# Patient Record
Sex: Female | Born: 1975 | Race: Black or African American | Hispanic: No | Marital: Single | Smoking: Never smoker
Health system: Southern US, Community
[De-identification: ages and names within clinical notes are randomized; demographics above are authoritative.]

## PROBLEM LIST (undated history)

## (undated) DIAGNOSIS — S3992XA Unspecified injury of lower back, initial encounter: Secondary | ICD-10-CM

## (undated) DIAGNOSIS — E7800 Pure hypercholesterolemia, unspecified: Secondary | ICD-10-CM

## (undated) DIAGNOSIS — I471 Supraventricular tachycardia, unspecified: Secondary | ICD-10-CM

## (undated) HISTORY — PX: LAPAROSCOPIC OVARIAN: SHX5906

---

## 2015-05-27 ENCOUNTER — Emergency Department (HOSPITAL_COMMUNITY)
Admission: EM | Admit: 2015-05-27 | Discharge: 2015-05-28 | Disposition: A | Discharge status: Home / Self Care | Payer: Medicaid Other | Attending: Emergency Medicine | Admitting: Emergency Medicine

## 2015-05-27 ENCOUNTER — Encounter (HOSPITAL_COMMUNITY): Payer: Self-pay

## 2015-05-27 DIAGNOSIS — Y92008 Other place in unspecified non-institutional (private) residence as the place of occurrence of the external cause: Secondary | ICD-10-CM | POA: Diagnosis not present

## 2015-05-27 DIAGNOSIS — Y9389 Activity, other specified: Secondary | ICD-10-CM | POA: Diagnosis not present

## 2015-05-27 DIAGNOSIS — Y998 Other external cause status: Secondary | ICD-10-CM | POA: Insufficient documentation

## 2015-05-27 DIAGNOSIS — W25XXXA Contact with sharp glass, initial encounter: Secondary | ICD-10-CM | POA: Insufficient documentation

## 2015-05-27 DIAGNOSIS — S61011A Laceration without foreign body of right thumb without damage to nail, initial encounter: Secondary | ICD-10-CM | POA: Diagnosis present

## 2015-05-27 DIAGNOSIS — Z23 Encounter for immunization: Secondary | ICD-10-CM | POA: Insufficient documentation

## 2015-05-27 DIAGNOSIS — S61219A Laceration without foreign body of unspecified finger without damage to nail, initial encounter: Secondary | ICD-10-CM

## 2015-05-27 NOTE — ED Notes (Signed)
I cut my right thumb over glass in a screen door per pt.

## 2015-05-28 MED ORDER — IBUPROFEN 800 MG PO TABS
800.0000 mg | ORAL_TABLET | Freq: Once | ORAL | Status: AC
  Administered 2015-05-28: 800 mg via ORAL
  Filled 2015-05-28: qty 1

## 2015-05-28 MED ORDER — LIDOCAINE HCL (PF) 1 % IJ SOLN
INTRAMUSCULAR | Status: AC
  Administered 2015-05-28: 01:00:00
  Filled 2015-05-28: qty 5

## 2015-05-28 MED ORDER — BACITRACIN-NEOMYCIN-POLYMYXIN 400-5-5000 EX OINT
TOPICAL_OINTMENT | Freq: Once | CUTANEOUS | Status: AC
  Administered 2015-05-28: 1 via TOPICAL
  Filled 2015-05-28: qty 1

## 2015-05-28 MED ORDER — TETANUS-DIPHTH-ACELL PERTUSSIS 5-2.5-18.5 LF-MCG/0.5 IM SUSP
0.5000 mL | Freq: Once | INTRAMUSCULAR | Status: AC
  Administered 2015-05-28: 0.5 mL via INTRAMUSCULAR
  Filled 2015-05-28: qty 0.5

## 2015-05-28 NOTE — ED Provider Notes (Signed)
CSN: [id number]     Arrival date & time 05/27/15  2222 History   First MD Initiated Contact with Patient 05/28/15 [phone]     Chief Complaint  Patient presents with  . Extremity Laceration     (Consider location/radiation/quality/duration/timing/severity/associated sxs/prior Treatment) Patient is a 39 y.o. female presenting with skin laceration. The history is provided by the patient.  Laceration Location:  Hand Hand laceration location:  R finger Depth:  Through underlying tissue Quality: jagged   Bleeding: controlled with pressure   Time since incident: just prior to arrival. Tetanus status:  Out of date  Shaunee Rochele RaringFaust is a 39 y.o. female who presents to the ED with a laceration to the right thumb. She states that she was on her porch and put her hand through the screen door. She has pain and bleeding to the right thumb. She denies any other injuries.  History reviewed. No pertinent past medical history. History reviewed. No pertinent past surgical history. No family history on file. History  Substance Use Topics  . Smoking status: Never Smoker   . Smokeless tobacco: Not on file  . Alcohol Use: No   OB History    No data available     Review of Systems Negative except as stated in HPI   Allergies  Metoprolol and Sulfa antibiotics  Home Medications   Prior to Admission medications   Not on File   BP 129/77 mmHg  Pulse 82  Temp(Src) 98.4 F (36.9 C) (Oral)  Resp 20  SpO2 100% Physical Exam  Constitutional: She is oriented to person, place, and time. She appears well-developed and well-nourished. No distress.  HENT:  Head: Normocephalic.  Eyes: EOM are normal.  Neck: Neck supple.  Cardiovascular: Normal rate.   Pulmonary/Chest: Effort normal.  Abdominal: Soft. There is no tenderness.  Musculoskeletal: Normal range of motion.       Right hand: She exhibits laceration. She exhibits normal range of motion. Normal sensation noted. Normal strength noted.   Neurological: She is alert and oriented to person, place, and time. No cranial nerve deficit.  Skin: Skin is warm and dry.  Psychiatric: She has a normal mood and affect. Her behavior is normal.  Nursing note and vitals reviewed.   ED Course  Procedures  LACERATION REPAIR Performed by: Breckyn Ticas Authorized by: Derry Arbogast Consent: Verbal consent obtained. Risks and benefits: risks, benefits and alternatives were discussed Consent given by: patient Patient identity confirmed: provided demographic data Prepped and Draped in normal sterile fashion Wound explored  Laceration Location: right thumb  Laceration Length: 3 cm  No Foreign Bodies seen or palpated  Anesthesia: local infiltration  Local anesthetic: lidocaine 1% without epinephrine  Anesthetic total: 4 ml  Irrigation method: syringe Amount of cleaning: standard  Skin closure: 4-0 prolene  Number of sutures: 5  Technique: interrupted  Patient tolerance: Patient tolerated the procedure well with no immediate complications.   MDM  39 y.o. female with laceration of the right thumb stable for d/c without focal neuro deficits of the right hand. She will follow up in 10 days for suture removal at her PCP or here. She will return sooner for any problems.   Final diagnoses:  Laceration of finger, right, initial encounter      [facility] Leven Hoel, NP 05/29/15 1634  Paula LibraJohn Molpus, MD 05/30/15 [phone]

## 2018-10-12 ENCOUNTER — Other Ambulatory Visit (HOSPITAL_COMMUNITY): Payer: Self-pay | Admitting: Preventative Medicine

## 2018-10-12 DIAGNOSIS — S20219A Contusion of unspecified front wall of thorax, initial encounter: Secondary | ICD-10-CM

## 2018-10-18 ENCOUNTER — Ambulatory Visit (HOSPITAL_COMMUNITY)
Admission: RE | Admit: 2018-10-18 | Discharge: 2018-10-18 | Disposition: A | Discharge status: Home / Self Care | Payer: Medicaid Other | Source: Ambulatory Visit | Attending: Preventative Medicine | Admitting: Preventative Medicine

## 2018-10-18 ENCOUNTER — Ambulatory Visit (HOSPITAL_COMMUNITY)
Admission: RE | Admit: 2018-10-18 | Discharge: 2018-10-18 | Disposition: A | Discharge status: Home / Self Care | Payer: Self-pay | Source: Ambulatory Visit | Attending: Preventative Medicine | Admitting: Preventative Medicine

## 2018-10-18 DIAGNOSIS — S20219A Contusion of unspecified front wall of thorax, initial encounter: Secondary | ICD-10-CM | POA: Insufficient documentation

## 2018-10-18 MED ORDER — TECHNETIUM TC 99M MEDRONATE IV KIT
20.0000 | PACK | Freq: Once | INTRAVENOUS | Status: AC | PRN
  Administered 2018-10-18: 21.9 via INTRAVENOUS

## 2019-06-01 ENCOUNTER — Other Ambulatory Visit: Payer: Self-pay

## 2019-06-01 ENCOUNTER — Emergency Department (HOSPITAL_COMMUNITY)
Admission: EM | Admit: 2019-06-01 | Discharge: 2019-06-01 | Disposition: A | Discharge status: Home / Self Care | Payer: Medicaid Other | Attending: Emergency Medicine | Admitting: Emergency Medicine

## 2019-06-01 ENCOUNTER — Emergency Department (HOSPITAL_COMMUNITY): Payer: Medicaid Other

## 2019-06-01 ENCOUNTER — Encounter (HOSPITAL_COMMUNITY): Payer: Self-pay | Admitting: Emergency Medicine

## 2019-06-01 DIAGNOSIS — Y92410 Unspecified street and highway as the place of occurrence of the external cause: Secondary | ICD-10-CM | POA: Insufficient documentation

## 2019-06-01 DIAGNOSIS — Y998 Other external cause status: Secondary | ICD-10-CM | POA: Diagnosis not present

## 2019-06-01 DIAGNOSIS — Y9389 Activity, other specified: Secondary | ICD-10-CM | POA: Diagnosis not present

## 2019-06-01 DIAGNOSIS — S39012A Strain of muscle, fascia and tendon of lower back, initial encounter: Secondary | ICD-10-CM | POA: Insufficient documentation

## 2019-06-01 DIAGNOSIS — S3992XA Unspecified injury of lower back, initial encounter: Secondary | ICD-10-CM | POA: Diagnosis present

## 2019-06-01 DIAGNOSIS — M47816 Spondylosis without myelopathy or radiculopathy, lumbar region: Secondary | ICD-10-CM | POA: Insufficient documentation

## 2019-06-01 MED ORDER — DIAZEPAM 5 MG PO TABS: 5.0000 mg | ORAL_TABLET | Freq: Three times a day (TID) | ORAL | 0 refills | Status: AC | PRN

## 2019-06-01 MED ORDER — LORAZEPAM 1 MG PO TABS: 1.0000 mg | ORAL_TABLET | Freq: Once | ORAL | Status: DC

## 2019-06-01 MED ORDER — HYDROCODONE-ACETAMINOPHEN 5-325 MG PO TABS
1.0000 | ORAL_TABLET | ORAL | 0 refills | Status: AC | PRN
Start: 2019-06-01 — End: ?

## 2019-06-01 MED ORDER — HYDROCODONE-ACETAMINOPHEN 5-325 MG PO TABS
1.0000 | ORAL_TABLET | Freq: Once | ORAL | Status: AC
  Administered 2019-06-01: 21:00:00 1 via ORAL
  Filled 2019-06-01: qty 1
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
## 2019-06-01 NOTE — Discharge Instructions (Addendum)
Use ice on the sore spots for 2 days after that use heat.  See your doctor for problems.
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
## 2020-08-08 ENCOUNTER — Emergency Department (HOSPITAL_BASED_OUTPATIENT_CLINIC_OR_DEPARTMENT_OTHER): Payer: BC Managed Care – PPO

## 2020-08-08 ENCOUNTER — Ambulatory Visit
Admission: EM | Admit: 2020-08-08 | Discharge: 2020-08-08 | Disposition: A | Discharge status: Home / Self Care | Payer: BC Managed Care – PPO

## 2020-08-08 ENCOUNTER — Other Ambulatory Visit: Payer: Self-pay

## 2020-08-08 ENCOUNTER — Encounter (HOSPITAL_BASED_OUTPATIENT_CLINIC_OR_DEPARTMENT_OTHER): Payer: Self-pay

## 2020-08-08 ENCOUNTER — Emergency Department (HOSPITAL_BASED_OUTPATIENT_CLINIC_OR_DEPARTMENT_OTHER)
Admission: EM | Admit: 2020-08-08 | Discharge: 2020-08-08 | Disposition: A | Discharge status: Home / Self Care | Payer: BC Managed Care – PPO | Attending: Emergency Medicine | Admitting: Emergency Medicine

## 2020-08-08 DIAGNOSIS — N83291 Other ovarian cyst, right side: Secondary | ICD-10-CM | POA: Insufficient documentation

## 2020-08-08 DIAGNOSIS — R630 Anorexia: Secondary | ICD-10-CM | POA: Diagnosis not present

## 2020-08-08 DIAGNOSIS — R638 Other symptoms and signs concerning food and fluid intake: Secondary | ICD-10-CM | POA: Diagnosis not present

## 2020-08-08 DIAGNOSIS — R1031 Right lower quadrant pain: Secondary | ICD-10-CM

## 2020-08-08 DIAGNOSIS — R110 Nausea: Secondary | ICD-10-CM | POA: Insufficient documentation

## 2020-08-08 DIAGNOSIS — N83201 Unspecified ovarian cyst, right side: Secondary | ICD-10-CM

## 2020-08-08 HISTORY — DX: Unspecified injury of lower back, initial encounter: S39.92XA

## 2020-08-08 LAB — COMPREHENSIVE METABOLIC PANEL
ALT: 16 U/L (ref 0–44)
AST: 13 U/L — ABNORMAL LOW (ref 15–41)
Albumin: 4.1 g/dL (ref 3.5–5.0)
Alkaline Phosphatase: 35 U/L — ABNORMAL LOW (ref 38–126)
Anion gap: 10 (ref 5–15)
BUN: 12 mg/dL (ref 6–20)
CO2: 23 mmol/L (ref 22–32)
Calcium: 9 mg/dL (ref 8.9–10.3)
Chloride: 100 mmol/L (ref 98–111)
Creatinine, Ser: 0.81 mg/dL (ref 0.44–1.00)
GFR calc Af Amer: >60 mL/min (ref >60)
GFR calc non Af Amer: >60 mL/min (ref >60)
Glucose, Bld: 97 mg/dL (ref 70–99)
Potassium: 3.8 mmol/L (ref 3.5–5.1)
Sodium: 133 mmol/L — ABNORMAL LOW (ref 135–145)
Total Bilirubin: 0.5 mg/dL (ref 0.3–1.2)
Total Protein: 8.2 g/dL — ABNORMAL HIGH (ref 6.5–8.1)

## 2020-08-08 LAB — URINALYSIS, ROUTINE W REFLEX MICROSCOPIC
Bilirubin Urine: NEGATIVE
Glucose, UA: NEGATIVE mg/dL
Hgb urine dipstick: NEGATIVE
Ketones, ur: NEGATIVE mg/dL
Nitrite: NEGATIVE
Protein, ur: NEGATIVE mg/dL
Specific Gravity, Urine: >1.03 — ABNORMAL HIGH (ref 1.005–1.030)
pH: 5.5 (ref 5.0–8.0)

## 2020-08-08 LAB — URINALYSIS, MICROSCOPIC (REFLEX)

## 2020-08-08 LAB — LIPASE, BLOOD: Lipase: 27 U/L (ref 11–51)

## 2020-08-08 LAB — CBC
HCT: 39.6 % (ref 36.0–46.0)
Hemoglobin: 12.1 g/dL (ref 12.0–15.0)
MCH: 21.1 pg — ABNORMAL LOW (ref 26.0–34.0)
MCHC: 30.6 g/dL (ref 30.0–36.0)
MCV: 69 fL — ABNORMAL LOW (ref 80.0–100.0)
Platelets: 267 10*3/uL (ref 150–400)
RBC: 5.74 MIL/uL — ABNORMAL HIGH (ref 3.87–5.11)
RDW: 18.2 % — ABNORMAL HIGH (ref 11.5–15.5)
WBC: 6 10*3/uL (ref 4.0–10.5)
nRBC: 0 % (ref 0.0–0.2)

## 2020-08-08 LAB — PREGNANCY, URINE: Preg Test, Ur: NEGATIVE

## 2020-08-08 MED ORDER — IOHEXOL 300 MG/ML  SOLN
100.0000 mL | Freq: Once | INTRAMUSCULAR | Status: AC | PRN
  Administered 2020-08-08: 100 mL via INTRAVENOUS

## 2020-08-08 MED ORDER — MORPHINE SULFATE (PF) 4 MG/ML IV SOLN
4.0000 mg | Freq: Once | INTRAVENOUS | Status: AC
  Administered 2020-08-08: 4 mg via INTRAVENOUS
  Filled 2020-08-08: qty 1

## 2020-08-08 MED ORDER — ONDANSETRON HCL 4 MG/2ML IJ SOLN
4.0000 mg | Freq: Once | INTRAMUSCULAR | Status: AC
  Administered 2020-08-08: 4 mg via INTRAVENOUS
  Filled 2020-08-08: qty 2

## 2020-08-08 MED ORDER — HYDROCODONE-ACETAMINOPHEN 5-325 MG PO TABS
1.0000 | ORAL_TABLET | ORAL | 0 refills | Status: AC | PRN
Start: 2020-08-08 — End: ?

## 2020-08-08 MED ORDER — SODIUM CHLORIDE 0.9 % IV BOLUS
1000.0000 mL | Freq: Once | INTRAVENOUS | Status: AC
  Administered 2020-08-08: 1000 mL via INTRAVENOUS

## 2020-08-08 NOTE — ED Notes (Signed)
ED Provider at bedside. 

## 2020-08-08 NOTE — ED Notes (Signed)
Patient is being discharged from the Urgent Care and sent to the Emergency Department via pov . Per b. wurst patient is in need of higher level of care due to rlq pain r/o appendicitis/ ectopic . Patient is aware and verbalizes understanding of plan of care. There were no vitals filed for this visit.

## 2020-08-08 NOTE — Discharge Instructions (Signed)
Your work-up today did not show evidence of appendicitis but did show a large ovarian cyst.  We offered pelvic exam and ultrasound via transfer tonight but after our discussion, we felt it is reasonable to have you follow-up with your OB/GYN tomorrow for both exam and ultrasound.  Please use the pain medicines up with your discomfort and follow-up with them.  If any symptoms change or worsen acutely, please return to the nearest emergency department.  Please rest and stay hydrated.

## 2020-08-08 NOTE — ED Triage Notes (Signed)
RLQ pain that woke her out of her sleep last night. Denies any urinary s/s

## 2020-08-08 NOTE — ED Provider Notes (Signed)
[facility] EMERGENCY DEPARTMENT Provider Note   CSN: [id number] Arrival date & time: 08/08/20  1434     History Chief Complaint  Patient presents with   Abdominal Pain    Amber Hamilton is a 44 y.o. female.  The history is provided by the patient and medical records. No language interpreter was used.  Abdominal Pain Pain location:  R flank, RLQ and RUQ Pain quality: aching and cramping   Pain radiates to:  Does not radiate Pain severity:  Moderate Onset quality:  Gradual Duration:  1 day Timing:  Constant Progression:  Waxing and waning Chronicity:  New Relieved by:  Nothing Worsened by:  Palpation Ineffective treatments:  None tried Associated symptoms: anorexia and nausea   Associated symptoms: no chest pain, no chills, no constipation, no cough, no diarrhea, no dysuria, no fatigue, no fever, no melena, no shortness of breath, no sore throat, no vaginal bleeding, no vaginal discharge and no vomiting   Risk factors: has not had multiple surgeries, not pregnant and no recent hospitalization        Past Medical History:  Diagnosis Date   Back injury     There are no problems to display for this patient.   History reviewed. No pertinent surgical history.   OB History   No obstetric history on file.     No family history on file.  Social History   Tobacco Use   Smoking status: Never Smoker   Smokeless tobacco: Never Used  Vaping Use   Vaping Use: Never used  Substance Use Topics   Alcohol use: No   Drug use: No    Home Medications Prior to Admission medications   Medication Sig Start Date End Date Taking? Authorizing Provider  diazepam (VALIUM) 5 MG tablet Take 1 tablet (5 mg total) by mouth every 8 (eight) hours as needed for muscle spasms (spasms). 06/01/19   Mancel BaleWentz, Elliott, MD  HYDROcodone-acetaminophen (NORCO) 5-325 MG tablet Take 1 tablet by mouth every 4 (four) hours as needed for moderate pain. 06/01/19   Mancel BaleWentz, Elliott, MD     Allergies    Metoprolol, Skelaxin [metaxalone], and Sulfa antibiotics  Review of Systems   Review of Systems  Constitutional: Positive for appetite change. Negative for chills, diaphoresis, fatigue and fever.  HENT: Negative for congestion and sore throat.   Eyes: Negative for visual disturbance.  Respiratory: Negative for cough, chest tightness, shortness of breath, wheezing and stridor.   Cardiovascular: Negative for chest pain.  Gastrointestinal: Positive for abdominal pain, anorexia and nausea. Negative for abdominal distention, constipation, diarrhea, melena and vomiting.  Genitourinary: Positive for flank pain. Negative for decreased urine volume, difficulty urinating, dysuria, frequency, pelvic pain, urgency, vaginal bleeding, vaginal discharge and vaginal pain.  Musculoskeletal: Positive for back pain. Negative for neck pain and neck stiffness.  Skin: Negative for rash and wound.  Neurological: Negative for light-headedness and headaches.  Psychiatric/Behavioral: Negative for agitation and confusion.  All other systems reviewed and are negative.   Physical Exam Updated Vital Signs BP 126/83 (BP Location: Left Arm)    Pulse 92    Temp 98.9 F (37.2 C) (Oral)    Resp 20    Ht 5\' 4"  (1.626 m)    Wt 103.4 kg    SpO2 99%    BMI 39.14 kg/m   Physical Exam Vitals and nursing note reviewed.  Constitutional:      General: She is not in acute distress.    Appearance: She is well-developed. She is  not ill-appearing, toxic-appearing or diaphoretic.  HENT:     Head: Normocephalic and atraumatic.     Right Ear: External ear normal.     Left Ear: External ear normal.     Nose: Nose normal.     Mouth/Throat:     Pharynx: No oropharyngeal exudate.  Eyes:     Conjunctiva/sclera: Conjunctivae normal.     Pupils: Pupils are equal, round, and reactive to light.  Cardiovascular:     Rate and Rhythm: Normal rate.     Heart sounds: Normal heart sounds. No murmur heard.   Pulmonary:      Effort: Pulmonary effort is normal. No respiratory distress.     Breath sounds: No stridor. No wheezing, rhonchi or rales.  Chest:     Chest wall: No tenderness.  Abdominal:     General: Abdomen is flat. Bowel sounds are normal. There is no distension.     Tenderness: There is abdominal tenderness in the right upper quadrant, right lower quadrant and periumbilical area. There is no right CVA tenderness, left CVA tenderness, guarding or rebound.  Musculoskeletal:     Cervical back: Normal range of motion and neck supple.  Skin:    General: Skin is warm.     Capillary Refill: Capillary refill takes less than 2 seconds.     Findings: No erythema or rash.  Neurological:     General: No focal deficit present.     Mental Status: She is alert and oriented to person, place, and time.     Motor: No abnormal muscle tone.     Coordination: Coordination normal.     Deep Tendon Reflexes: Reflexes are normal and symmetric.  Psychiatric:        Mood and Affect: Mood normal.     ED Results / Procedures / Treatments   Labs (all labs ordered are listed, but only abnormal results are displayed) Labs Reviewed  COMPREHENSIVE METABOLIC PANEL - Abnormal; Notable for the following components:      Result Value   Sodium 133 (*)    Total Protein 8.2 (*)    AST 13 (*)    Alkaline Phosphatase 35 (*)    All other components within normal limits  CBC - Abnormal; Notable for the following components:   RBC 5.74 (*)    MCV 69.0 (*)    MCH 21.1 (*)    RDW 18.2 (*)    All other components within normal limits  URINALYSIS, ROUTINE W REFLEX MICROSCOPIC - Abnormal; Notable for the following components:   Specific Gravity, Urine >1.030 (*)    Leukocytes,Ua TRACE (*)    All other components within normal limits  URINALYSIS, MICROSCOPIC (REFLEX) - Abnormal; Notable for the following components:   Bacteria, UA MANY (*)    All other components within normal limits  LIPASE, BLOOD  PREGNANCY, URINE     EKG None  Radiology CT ABDOMEN PELVIS W CONTRAST  Result Date: 08/08/2020 CLINICAL DATA:  Right lower quadrant pain EXAM: CT ABDOMEN AND PELVIS WITH CONTRAST TECHNIQUE: Multidetector CT imaging of the abdomen and pelvis was performed using the standard protocol following bolus administration of intravenous contrast. CONTRAST:  OMNIPAQUE IOHEXOL 300 MG/ML  SOLN COMPARISON:  None. FINDINGS: Lower chest: Lung bases are clear. No effusions. Heart is normal size. Hepatobiliary: No focal hepatic abnormality. Gallbladder unremarkable. Pancreas: No focal abnormality or ductal dilatation. Spleen: No focal abnormality.  Normal size. Adrenals/Urinary Tract: No adrenal abnormality. No focal renal abnormality. No stones or hydronephrosis. Urinary  bladder is unremarkable. Stomach/Bowel: Few scattered sigmoid diverticula. Moderate stool burden throughout the colon. Stomach, large and small bowel grossly unremarkable. Normal appendix. Vascular/Lymphatic: No evidence of aneurysm or adenopathy. Reproductive: Uterus and left adnexa unremarkable. 9 cm cyst seen in the right adnexa, presumably ovarian. Other: No free fluid or free air. Musculoskeletal: No acute bony abnormality. IMPRESSION: Normal appendix. 9 cm right adnexal/ovarian cyst. This could be further characterized with pelvic ultrasound. Electronically Signed   By: Charlett Nose M.D.   On: 08/08/2020 18:12    Procedures Procedures (including critical care time)  Medications Ordered in ED Medications  morphine 4 MG/ML injection 4 mg (4 mg Intravenous Given 08/08/20 1721)  ondansetron (ZOFRAN) injection 4 mg (4 mg Intravenous Given 08/08/20 1720)  sodium chloride 0.9 % bolus 1,000 mL (0 mLs Intravenous Stopped 08/08/20 1931)  iohexol (OMNIPAQUE) 300 MG/ML solution 100 mL (100 mLs Intravenous Contrast Given 08/08/20 1756)     ED Course  I have reviewed the triage vital signs and the nursing notes.  Pertinent labs & imaging results that were  available during my care of the patient were reviewed by me and considered in my medical decision making (see chart for details).    MDM Rules/Calculators/A&P                          Aariona Amendola is a 44 y.o. female with a past medical history significant for chronic back pain status post intermittent injections who presents at the direction of PCP for evaluation of right lower quadrant abdominal pain.  She reports that yesterday, she started having pain in her right hemiabdomen that she describes as moderate.  She reported some nausea but no vomiting.  She has no appetite.  She denies any vaginal discharge or vaginal bleeding or pelvic symptoms.  She she reported no fevers or chills and denies new trauma.  She denies chest pain, shortness of breath, or breathing difficulties.  She reports no significant urinary changes but does report her pain is in her right flank and back as well.  No rashes or shingles reported.  No history of this discomfort.  She was told to come here for imaging to rule out appendicitis.   On exam, patient does have tenderness in her right upper quadrant and right lower quadrant.  Minimal tenderness in her suprapubic area.  Some tenderness in the right flank but no rash was seen.  No significant back tenderness otherwise with no midline tenderness.  Normal pulses in legs.  No leg tenderness or leg swelling.  Lungs clear and chest nontender.  No murmur.  Patient resting comfortably.  Given her right lower bone abdominal pain and right abdominal pain, I do think a CT would be beneficial to rule out appendicitis, cholecystitis, and less likely looking for pelvic pathology.  Based on her description of of the symptoms and location discomfort I am more concerned about an intra-abdominal pathology over a pelvic pathology at this time.  Also, we do not have pelvic ultrasound available at this facility at this time now.  We also cannot do a rectal quadrant ultrasound initially due to  this.  We will give the patient pain medicine, get labs, give her some fluids, nausea medicine, and get a CT scan to evaluate.  If CT is completely reassuring, we discussed having a shared decision-making conversation about having her follow-up with OB/GYN for pelvic exam/ultrasound versus doing an exam today and then transferring her this evening  for ultrasound.  Patient is agreement with this plan.  Anticipate reassessment after work-up.  CT scan showed no evidence of appendicitis and the appendix was normal-appearing.  Patient did have a large right-sided cyst likely ovarian in etiology that was 9 cm.  Laboratory testing was overall reassuring.  Urinalysis did not show nitrites and given lack of urinary symptoms, doubt UTI.  We held a shared decision-making conversation with the patient.  We offered a pelvic exam and transfer for pelvic ultrasound tonight given the CT scan recommending it should be considered; however, patient would rather call her OB/GYN tomorrow to get both completed by the same provider tomorrow.  She reports he feels much better after pain medicine and now that appendicitis is ruled out, she would like to go home.  We discussed how we did not definitively rule out torsion at this time but patient understands this and would still like to go.  She will given prescription for pain medication and will follow up with her OB/GYN tomorrow for pelvic exam and ultrasound.  She has no other questions or concerns and was discharged in good condition with improved symptoms.      Final Clinical Impression(s) / ED Diagnoses Final diagnoses:  Right lower quadrant abdominal pain  Cyst of right ovary    Rx / DC Orders ED Discharge Orders         Ordered    HYDROcodone-acetaminophen (NORCO/VICODIN) 5-325 MG tablet  Every 4 hours PRN     Discontinue  Reprint     08/08/20 2011          Clinical Impression: 1. Right lower quadrant abdominal pain   2. Cyst of right ovary      Disposition: Discharge  Condition: Good  I have discussed the results, Dx and Tx plan with the pt(& family if present). He/she/they expressed understanding and agree(s) with the plan. Discharge instructions discussed at great length. Strict return precautions discussed and pt &/or family have verbalized understanding of the instructions. No further questions at time of discharge.    New Prescriptions   HYDROCODONE-ACETAMINOPHEN (NORCO/VICODIN) 5-325 MG TABLET    Take 1 tablet by mouth every 4 (four) hours as needed.    Follow Up: Your OBGYN for pelvic exam and ultrasound tomorrow.        Hisae Decoursey, Canary Brim, MD 08/08/20 2012

## 2020-08-08 NOTE — ED Notes (Signed)
Pt ambulated to bathroom without difficulty.

## 2020-08-08 NOTE — ED Triage Notes (Addendum)
Pt c/o intermittient RLQ pain started 7pm yesterday-NAD-steady gait

## 2020-08-24 ENCOUNTER — Emergency Department (HOSPITAL_COMMUNITY)
Admission: EM | Admit: 2020-08-24 | Discharge: 2020-08-24 | Disposition: A | Discharge status: Home / Self Care | Payer: BC Managed Care – PPO | Attending: Emergency Medicine | Admitting: Emergency Medicine

## 2020-08-24 ENCOUNTER — Emergency Department (HOSPITAL_COMMUNITY): Payer: BC Managed Care – PPO

## 2020-08-24 ENCOUNTER — Other Ambulatory Visit: Payer: Self-pay

## 2020-08-24 ENCOUNTER — Encounter (HOSPITAL_COMMUNITY): Payer: Self-pay

## 2020-08-24 DIAGNOSIS — R531 Weakness: Secondary | ICD-10-CM | POA: Diagnosis present

## 2020-08-24 DIAGNOSIS — U071 COVID-19: Secondary | ICD-10-CM | POA: Diagnosis not present

## 2020-08-24 DIAGNOSIS — Z79899 Other long term (current) drug therapy: Secondary | ICD-10-CM | POA: Insufficient documentation

## 2020-08-24 LAB — URINALYSIS, ROUTINE W REFLEX MICROSCOPIC
Bacteria, UA: NONE SEEN
Bilirubin Urine: NEGATIVE
Glucose, UA: NEGATIVE mg/dL
Hgb urine dipstick: NEGATIVE
Ketones, ur: NEGATIVE mg/dL
Nitrite: NEGATIVE
Protein, ur: NEGATIVE mg/dL
Specific Gravity, Urine: 1.024 (ref 1.005–1.030)
pH: 5 (ref 5.0–8.0)

## 2020-08-24 LAB — CBC WITH DIFFERENTIAL/PLATELET
Abs Immature Granulocytes: 0.01 10*3/uL (ref 0.00–0.07)
Basophils Absolute: 0 10*3/uL (ref 0.0–0.1)
Basophils Relative: 0 %
Eosinophils Absolute: 0 10*3/uL (ref 0.0–0.5)
Eosinophils Relative: 0 %
HCT: 39.4 % (ref 36.0–46.0)
Hemoglobin: 12.2 g/dL (ref 12.0–15.0)
Immature Granulocytes: 0 %
Lymphocytes Relative: 52 %
Lymphs Abs: 1.7 10*3/uL (ref 0.7–4.0)
MCH: 21.6 pg — ABNORMAL LOW (ref 26.0–34.0)
MCHC: 31 g/dL (ref 30.0–36.0)
MCV: 69.7 fL — ABNORMAL LOW (ref 80.0–100.0)
Monocytes Absolute: 0.4 10*3/uL (ref 0.1–1.0)
Monocytes Relative: 11 %
Neutro Abs: 1.2 10*3/uL — ABNORMAL LOW (ref 1.7–7.7)
Neutrophils Relative %: 37 %
Platelets: 148 10*3/uL — ABNORMAL LOW (ref 150–400)
RBC: 5.65 MIL/uL — ABNORMAL HIGH (ref 3.87–5.11)
RDW: 18.5 % — ABNORMAL HIGH (ref 11.5–15.5)
WBC: 3.3 10*3/uL — ABNORMAL LOW (ref 4.0–10.5)
nRBC: 0 % (ref 0.0–0.2)

## 2020-08-24 LAB — COMPREHENSIVE METABOLIC PANEL
ALT: 35 U/L (ref 0–44)
AST: 35 U/L (ref 15–41)
Albumin: 3.9 g/dL (ref 3.5–5.0)
Alkaline Phosphatase: 33 U/L — ABNORMAL LOW (ref 38–126)
Anion gap: 10 (ref 5–15)
BUN: 12 mg/dL (ref 6–20)
CO2: 26 mmol/L (ref 22–32)
Calcium: 8.5 mg/dL — ABNORMAL LOW (ref 8.9–10.3)
Chloride: 97 mmol/L — ABNORMAL LOW (ref 98–111)
Creatinine, Ser: 0.86 mg/dL (ref 0.44–1.00)
GFR calc Af Amer: >60 mL/min (ref >60)
GFR calc non Af Amer: >60 mL/min (ref >60)
Glucose, Bld: 118 mg/dL — ABNORMAL HIGH (ref 70–99)
Potassium: 3.5 mmol/L (ref 3.5–5.1)
Sodium: 133 mmol/L — ABNORMAL LOW (ref 135–145)
Total Bilirubin: 0.3 mg/dL (ref 0.3–1.2)
Total Protein: 7.9 g/dL (ref 6.5–8.1)

## 2020-08-24 LAB — SARS CORONAVIRUS 2 BY RT PCR (HOSPITAL ORDER, PERFORMED IN ~~LOC~~ HOSPITAL LAB): SARS Coronavirus 2: POSITIVE — AB

## 2020-08-24 MED ORDER — SODIUM CHLORIDE 0.9 % IV SOLN
1200.0000 mg | Freq: Once | INTRAVENOUS | Status: AC
  Administered 2020-08-24: 1200 mg via INTRAVENOUS
  Filled 2020-08-24: qty 10

## 2020-08-24 MED ORDER — ACETAMINOPHEN 325 MG PO TABS
650.0000 mg | ORAL_TABLET | Freq: Once | ORAL | Status: AC
  Administered 2020-08-24: 650 mg via ORAL
  Filled 2020-08-24: qty 2

## 2020-08-24 MED ORDER — DIPHENHYDRAMINE HCL 50 MG/ML IJ SOLN: 50.0000 mg | Freq: Once | INTRAMUSCULAR | Status: DC | PRN

## 2020-08-24 MED ORDER — EPINEPHRINE 0.3 MG/0.3ML IJ SOAJ: 0.3000 mg | Freq: Once | INTRAMUSCULAR | Status: DC | PRN

## 2020-08-24 MED ORDER — SODIUM CHLORIDE 0.9 % IV SOLN: INTRAVENOUS | Status: DC | PRN

## 2020-08-24 MED ORDER — FAMOTIDINE IN NACL 20-0.9 MG/50ML-% IV SOLN: 20.0000 mg | Freq: Once | INTRAVENOUS | Status: DC | PRN

## 2020-08-24 MED ORDER — ALBUTEROL SULFATE HFA 108 (90 BASE) MCG/ACT IN AERS
2.0000 | INHALATION_SPRAY | Freq: Once | RESPIRATORY_TRACT | Status: AC
  Administered 2020-08-24: 2 via RESPIRATORY_TRACT
  Filled 2020-08-24: qty 6.7

## 2020-08-24 MED ORDER — ALBUTEROL SULFATE HFA 108 (90 BASE) MCG/ACT IN AERS: 2.0000 | INHALATION_SPRAY | Freq: Once | RESPIRATORY_TRACT | Status: DC | PRN

## 2020-08-24 MED ORDER — METHYLPREDNISOLONE SODIUM SUCC 125 MG IJ SOLR: 125.0000 mg | Freq: Once | INTRAMUSCULAR | Status: DC | PRN
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
## 2020-08-24 NOTE — ED Provider Notes (Signed)
[facility] EMERGENCY DEPARTMENT Provider Note   CSN: [id number] Arrival date & time: 08/24/20  1323     History Chief Complaint  Patient presents with  . Covid Exposure  . Extremity Weakness    Amber Hamilton is a 44 y.o. female.  Patient with weakness and fever.  Exposure to COVID-19  The history is provided by the patient. No language interpreter was used.  Weakness Severity:  Mild Onset quality:  Gradual Timing:  Constant Progression:  Worsening Chronicity:  New Context: not alcohol use   Relieved by:  Nothing Worsened by:  Nothing Ineffective treatments:  None tried Associated symptoms: no abdominal pain, no chest pain, no cough, no diarrhea, no frequency, no headaches and no seizures        Past Medical History:  Diagnosis Date  . Back injury     There are no problems to display for this patient.   History reviewed. No pertinent surgical history.   OB History   No obstetric history on file.     No family history on file.  Social History   Tobacco Use  . Smoking status: Never Smoker  . Smokeless tobacco: Never Used  Vaping Use  . Vaping Use: Never used  Substance Use Topics  . Alcohol use: No  . Drug use: No    Home Medications Prior to Admission medications   Medication Sig Start Date End Date Taking? Authorizing Provider  diazepam (VALIUM) 5 MG tablet Take 1 tablet (5 mg total) by mouth every 8 (eight) hours as needed for muscle spasms (spasms). 06/01/19   Mancel Bale, MD  HYDROcodone-acetaminophen (NORCO) 5-325 MG tablet Take 1 tablet by mouth every 4 (four) hours as needed for moderate pain. 06/01/19   Mancel Bale, MD  HYDROcodone-acetaminophen (NORCO/VICODIN) 5-325 MG tablet Take 1 tablet by mouth every 4 (four) hours as needed. 08/08/20   Tegeler, Canary Brim, MD    Allergies    Metoprolol, Skelaxin [metaxalone], and Sulfa antibiotics  Review of Systems   Review of Systems  Constitutional: Negative for appetite change and  fatigue.  HENT: Negative for congestion, ear discharge and sinus pressure.   Eyes: Negative for discharge.  Respiratory: Negative for cough.   Cardiovascular: Negative for chest pain.  Gastrointestinal: Negative for abdominal pain and diarrhea.  Genitourinary: Negative for frequency and hematuria.  Musculoskeletal: Negative for back pain.  Skin: Negative for rash.  Neurological: Positive for weakness. Negative for seizures and headaches.  Psychiatric/Behavioral: Negative for hallucinations.    Physical Exam Updated Vital Signs BP 118/86 (BP Location: Left Arm)   Pulse 87   Temp 99.2 F (37.3 C) (Oral)   Resp 17   Ht 5\' 4"  (1.626 m)   Wt 99.8 kg   SpO2 95%   BMI 37.76 kg/m   Physical Exam Vitals and nursing note reviewed.  Constitutional:      Appearance: She is well-developed.  HENT:     Head: Normocephalic.     Nose: Nose normal.  Eyes:     General: No scleral icterus.    Conjunctiva/sclera: Conjunctivae normal.  Neck:     Thyroid: No thyromegaly.  Cardiovascular:     Rate and Rhythm: Normal rate and regular rhythm.     Heart sounds: No murmur heard.  No friction rub. No gallop.   Pulmonary:     Breath sounds: No stridor. No wheezing or rales.  Chest:     Chest wall: No tenderness.  Abdominal:     General: There is no  distension.     Tenderness: There is no abdominal tenderness. There is no rebound.  Musculoskeletal:        General: Normal range of motion.     Cervical back: Neck supple.  Lymphadenopathy:     Cervical: No cervical adenopathy.  Skin:    Findings: No erythema or rash.  Neurological:     Mental Status: She is oriented to person, place, and time.     Motor: No abnormal muscle tone.     Coordination: Coordination normal.  Psychiatric:        Behavior: Behavior normal.     ED Results / Procedures / Treatments   Labs (all labs ordered are listed, but only abnormal results are displayed) Labs Reviewed  SARS CORONAVIRUS 2 BY RT PCR  (HOSPITAL ORDER, PERFORMED IN  HOSPITAL LAB) - Abnormal; Notable for the following components:      Result Value   SARS Coronavirus 2 POSITIVE (*)    All other components within normal limits  CBC WITH DIFFERENTIAL/PLATELET - Abnormal; Notable for the following components:   WBC 3.3 (*)    RBC 5.65 (*)    MCV 69.7 (*)    MCH 21.6 (*)    RDW 18.5 (*)    Platelets 148 (*)    Neutro Abs 1.2 (*)    All other components within normal limits  COMPREHENSIVE METABOLIC PANEL - Abnormal; Notable for the following components:   Sodium 133 (*)    Chloride 97 (*)    Glucose, Bld 118 (*)    Calcium 8.5 (*)    Alkaline Phosphatase 33 (*)    All other components within normal limits  URINALYSIS, ROUTINE W REFLEX MICROSCOPIC - Abnormal; Notable for the following components:   APPearance HAZY (*)    Leukocytes,Ua TRACE (*)    All other components within normal limits    EKG None  Radiology DG Chest Port 1 View  Result Date: 08/24/2020 CLINICAL DATA:  Shortness of breath, history of recent COVID exposure EXAM: PORTABLE CHEST 1 VIEW COMPARISON:  None. FINDINGS: Cardiac shadow is within normal limits. The lungs are well aerated bilaterally. Minimal bibasilar opacities are noted likely representing atelectasis. No bony abnormality is seen. IMPRESSION: Minimal bibasilar airspace opacities likely representing atelectasis. Electronically Signed   By: Alcide Clever M.D.   On: 08/24/2020 19:25    Procedures Procedures (including critical care time)  Medications Ordered in ED Medications  0.9 %  sodium chloride infusion (has no administration in time range)  diphenhydrAMINE (BENADRYL) injection 50 mg (has no administration in time range)  famotidine (PEPCID) IVPB 20 mg premix (has no administration in time range)  methylPREDNISolone sodium succinate (SOLU-MEDROL) 125 mg/2 mL injection 125 mg (has no administration in time range)  albuterol (VENTOLIN HFA) 108 (90 Base) MCG/ACT inhaler 2 puff  (has no administration in time range)  EPINEPHrine (EPI-PEN) injection 0.3 mg (has no administration in time range)  albuterol (VENTOLIN HFA) 108 (90 Base) MCG/ACT inhaler 2 puff (has no administration in time range)  acetaminophen (TYLENOL) tablet 650 mg (650 mg Oral Given 08/24/20 1906)  casirivimab-imdevimab (REGEN-COV) 1,200 mg in sodium chloride 0.9 % 110 mL IVPB (0 mg Intravenous Stopped 08/24/20 2227)    ED Course  I have reviewed the triage vital signs and the nursing notes.  Pertinent labs & imaging results that were available during my care of the patient were reviewed by me and considered in my medical decision making (see chart for details).  Amber Hamilton was evaluated in Emergency Department on 08/27/2020 for the symptoms described in the history of present illness. She was evaluated in the context of the global COVID-19 pandemic, which necessitated consideration that the patient might be at risk for infection with the SARS-CoV-2 virus that causes COVID-19. Institutional protocols and algorithms that pertain to the evaluation of patients at risk for COVID-19 are in a state of rapid change based on information released by regulatory bodies including the CDC and federal and state organizations. These policies and algorithms were followed during the patient's care in the ED.  MDM Rules/Calculators/A&P                           Patient with COVID-19. She was given the monoclonal antibody and will follow-up as needed       This patient presents to the ED for concern of weakness fever this involves an extensive number of treatment options, and is a complaint that carries with it a high risk of complications and morbidity.  The differential diagnosis includes UTI Covid   Lab Tests:   I Ordered, reviewed, and interpreted labs, which included CBC and chemistries that showed mild leukopenia  Medicines ordered:   I ordered medication albuterol steroids and monoclonal  antibody  Imaging Studies ordered:   I ordered imaging studies which included chest x-ray  I independently visualized and interpreted imaging which showed bibasilar infiltrates  Additional history obtained:   Additional history obtained from record  Previous records obtained and reviewed.  Consultations Obtained:     Reevaluation:  After the interventions stated above, I reevaluated the patient and found mild improvement  Critical Interventions:  .   Final Clinical Impression(s) / ED Diagnoses Final diagnoses:  COVID-19    Rx / DC Orders ED Discharge Orders    None       Bethann Berkshire, MD 08/27/20 1057

## 2020-08-24 NOTE — Progress Notes (Signed)
Pharmacy COVID-19 Monoclonal Antibody Screening  Vaishnavi Wermuth was identified as not hospitalized with symptoms from Covid-19 on admission but an incidental positive PCR has been documented.  The patient may qualify for the use of monoclonal antibodies (mAB) for COVID-19 viral infection to prevent worsening symptoms stemming from Covid-19 infection.  The patient was identified based on a positive COVID-19 PCR and not requiring the use of supplemental oxygen at this time.  Patient is not being admitted for COVID.  Anticipate they will receive the infusion and be discharged home.  This patient meets the FDA criteria for Emergency Use Authorization of casirivimab/imdevimab.  Has a (+) direct SARS-CoV-2 viral test result  Is NOT hospitalized due to COVID-19  Is within 10 days of symptom onset  Has at least one of the high risk factor(s) for progression to severe COVID-19 and/or hospitalization as defined in EUA.  Specific high risk criteria : BMI > 25  Additionally: The patient has had a positive COVID-19 PCR in the last 90 days.  The patient is unvaccinated against COVID-19.  Since the patient is unvaccinated and meets high risk criteria, the patient is eligible for mAB administration.   This eligibility and indication for treatment was initiated by patient's physician: Dr. Estell Harpin  Plan: It was decided that the patient will receive one dose of mAB combination.Casirivimab/imdevimab has been ordered. Pharmacy will coordinate administration timing with patient's nurse. Recommended infusion monitoring parameters communicated to the nursing team.   Brandon Melnick 08/24/2020  10:19 PM

## 2020-08-24 NOTE — ED Triage Notes (Signed)
Pt reports exposed to covid last week. Pt says that she took a rapid test and was positive and pcr was negative. Pt reports weakness, cough and all symptoms that anyone can have when you look up covid

## 2020-08-24 NOTE — ED Notes (Signed)
Regen-Cov completed.  No adverse reaction

## 2020-08-24 NOTE — Discharge Instructions (Addendum)
Take Tylenol for fever drink plenty of fluids and rest and follow-up with your doctor if you are not improving. You can use the inhaler if you get some shortness of breath

## 2020-09-04 ENCOUNTER — Other Ambulatory Visit: Payer: BC Managed Care – PPO

## 2020-09-04 ENCOUNTER — Other Ambulatory Visit: Payer: Self-pay | Admitting: Radiology

## 2020-09-04 DIAGNOSIS — Z20822 Contact with and (suspected) exposure to covid-19: Secondary | ICD-10-CM

## 2020-09-07 LAB — NOVEL CORONAVIRUS, NAA

## 2021-04-27 ENCOUNTER — Encounter (HOSPITAL_COMMUNITY): Payer: Self-pay

## 2021-04-27 ENCOUNTER — Other Ambulatory Visit: Payer: Self-pay

## 2021-04-27 ENCOUNTER — Emergency Department (HOSPITAL_COMMUNITY): Payer: Medicaid Other

## 2021-04-27 ENCOUNTER — Emergency Department (HOSPITAL_COMMUNITY)
Admission: EM | Admit: 2021-04-27 | Discharge: 2021-04-27 | Disposition: A | Discharge status: Home / Self Care | Payer: Medicaid Other | Attending: Emergency Medicine | Admitting: Emergency Medicine

## 2021-04-27 DIAGNOSIS — N83201 Unspecified ovarian cyst, right side: Secondary | ICD-10-CM

## 2021-04-27 DIAGNOSIS — R112 Nausea with vomiting, unspecified: Secondary | ICD-10-CM | POA: Diagnosis not present

## 2021-04-27 DIAGNOSIS — R102 Pelvic and perineal pain: Secondary | ICD-10-CM | POA: Diagnosis not present

## 2021-04-27 DIAGNOSIS — N83202 Unspecified ovarian cyst, left side: Secondary | ICD-10-CM

## 2021-04-27 DIAGNOSIS — R1031 Right lower quadrant pain: Secondary | ICD-10-CM | POA: Diagnosis present

## 2021-04-27 LAB — URINALYSIS, ROUTINE W REFLEX MICROSCOPIC
Bacteria, UA: NONE SEEN
Bilirubin Urine: NEGATIVE
Glucose, UA: NEGATIVE mg/dL
Ketones, ur: NEGATIVE mg/dL
Nitrite: NEGATIVE
Protein, ur: NEGATIVE mg/dL
Specific Gravity, Urine: 1.028 (ref 1.005–1.030)
pH: 6 (ref 5.0–8.0)

## 2021-04-27 LAB — CBC WITH DIFFERENTIAL/PLATELET
Abs Immature Granulocytes: 0.01 10*3/uL (ref 0.00–0.07)
Basophils Absolute: 0 10*3/uL (ref 0.0–0.1)
Basophils Relative: 0 %
Eosinophils Absolute: 0 10*3/uL (ref 0.0–0.5)
Eosinophils Relative: 0 %
HCT: 37.9 % (ref 36.0–46.0)
Hemoglobin: 11.4 g/dL — ABNORMAL LOW (ref 12.0–15.0)
Immature Granulocytes: 0 %
Lymphocytes Relative: 22 %
Lymphs Abs: 1.2 10*3/uL (ref 0.7–4.0)
MCH: 20.3 pg — ABNORMAL LOW (ref 26.0–34.0)
MCHC: 30.1 g/dL (ref 30.0–36.0)
MCV: 67.6 fL — ABNORMAL LOW (ref 80.0–100.0)
Monocytes Absolute: 0.7 10*3/uL (ref 0.1–1.0)
Monocytes Relative: 12 %
Neutro Abs: 3.7 10*3/uL (ref 1.7–7.7)
Neutrophils Relative %: 66 %
Platelets: 266 10*3/uL (ref 150–400)
RBC: 5.61 MIL/uL — ABNORMAL HIGH (ref 3.87–5.11)
RDW: 20.8 % — ABNORMAL HIGH (ref 11.5–15.5)
WBC: 5.6 10*3/uL (ref 4.0–10.5)
nRBC: 0 % (ref 0.0–0.2)

## 2021-04-27 LAB — COMPREHENSIVE METABOLIC PANEL
ALT: 20 U/L (ref 0–44)
AST: 17 U/L (ref 15–41)
Albumin: 3.7 g/dL (ref 3.5–5.0)
Alkaline Phosphatase: 39 U/L (ref 38–126)
Anion gap: 6 (ref 5–15)
BUN: 15 mg/dL (ref 6–20)
CO2: 26 mmol/L (ref 22–32)
Calcium: 9.2 mg/dL (ref 8.9–10.3)
Chloride: 102 mmol/L (ref 98–111)
Creatinine, Ser: 0.7 mg/dL (ref 0.44–1.00)
GFR, Estimated: >60 mL/min (ref >60)
Glucose, Bld: 106 mg/dL — ABNORMAL HIGH (ref 70–99)
Potassium: 4.1 mmol/L (ref 3.5–5.1)
Sodium: 134 mmol/L — ABNORMAL LOW (ref 135–145)
Total Bilirubin: 0.7 mg/dL (ref 0.3–1.2)
Total Protein: 7.7 g/dL (ref 6.5–8.1)

## 2021-04-27 LAB — LIPASE, BLOOD: Lipase: 27 U/L (ref 11–51)

## 2021-04-27 LAB — HCG, QUANTITATIVE, PREGNANCY: hCG, Beta Chain, Quant, S: <1 m[IU]/mL (ref <5)

## 2021-04-27 MED ORDER — SODIUM CHLORIDE 0.9 % IV BOLUS
1000.0000 mL | Freq: Once | INTRAVENOUS | Status: AC
  Administered 2021-04-27: 1000 mL via INTRAVENOUS

## 2021-04-27 MED ORDER — KETOROLAC TROMETHAMINE 10 MG PO TABS: 10.0000 mg | ORAL_TABLET | Freq: Four times a day (QID) | ORAL | 0 refills | Status: DC | PRN

## 2021-04-27 MED ORDER — ONDANSETRON HCL 4 MG/2ML IJ SOLN
4.0000 mg | Freq: Once | INTRAMUSCULAR | Status: DC | PRN
Start: 2021-04-27 — End: 2021-04-28

## 2021-04-27 MED ORDER — KETOROLAC TROMETHAMINE 10 MG PO TABS: 10.0000 mg | ORAL_TABLET | Freq: Four times a day (QID) | ORAL | 0 refills | Status: AC | PRN

## 2021-04-27 MED ORDER — IOHEXOL 300 MG/ML  SOLN
100.0000 mL | Freq: Once | INTRAMUSCULAR | Status: AC | PRN
  Administered 2021-04-27: 100 mL via INTRAVENOUS

## 2021-04-27 MED ORDER — HYDROMORPHONE HCL 1 MG/ML IJ SOLN
0.5000 mg | INTRAMUSCULAR | Status: DC | PRN
  Administered 2021-04-27: 0.5 mg via INTRAVENOUS
  Filled 2021-04-27: qty 1

## 2021-04-27 MED ORDER — ONDANSETRON 4 MG PO TBDP: 4.0000 mg | ORAL_TABLET | Freq: Three times a day (TID) | ORAL | 0 refills | Status: AC | PRN

## 2021-04-27 MED ORDER — KETOROLAC TROMETHAMINE 30 MG/ML IJ SOLN
30.0000 mg | Freq: Once | INTRAMUSCULAR | Status: AC
  Administered 2021-04-27: 30 mg via INTRAVENOUS
  Filled 2021-04-27: qty 1

## 2021-04-27 MED ORDER — ONDANSETRON 4 MG PO TBDP
4.0000 mg | ORAL_TABLET | Freq: Once | ORAL | Status: AC
  Administered 2021-04-27: 4 mg via ORAL
  Filled 2021-04-27: qty 1

## 2021-04-27 NOTE — ED Triage Notes (Signed)
Emergency Medicine Provider Triage Evaluation Note  Amber Hamilton , a 45 y.o. female  was evaluated in triage.  Pt complains of right-sided abdominal pain onset yesterday, similar to prior ovarian cyst.  Told a year ago she had a 9 cm ovarian cyst, follow-up with her gynecologist who told her as long as was not bothering her not to do anything about it.  Reports nausea and vomiting today after taking hydrocodone, thinks this was caused by the medication.  States slight dysuria, denies frequency, changes in bowel habits.  No prior abdominal surgeries.  Review of Systems  Positive: Right lower quadrant pain, nausea, vomiting, dysuria Negative: Changes in bowel habits, fever  Physical Exam  BP 108/80 (BP Location: Right Arm)   Pulse 87   Temp 99.1 F (37.3 C) (Oral)   Resp 18   Ht 5\' 4"  (1.626 m)   Wt 99.8 kg   SpO2 100%   BMI 37.76 kg/m  Gen:   Awake, no distress, appears uncomfortable, leaning over recliner rocking HEENT:  Atraumatic  Resp:  Normal effort  Cardiac:  Normal rate  Abd:   Nondistended, nontender  MSK:   Moves extremities without difficulty, no CVA tenderness  Neuro:  Speech clear   Medical Decision Making  Medically screening exam initiated at 2:32 PM.  Appropriate orders placed.  Amber Hamilton was informed that the remainder of the evaluation will be completed by another provider, this initial triage assessment does not replace that evaluation, and the importance of remaining in the ED until their evaluation is complete.  Clinical Impression  Zofran ordered as well as labs for abdominal pain per protocol and pelvic ultrasound for history of ovarian cyst.   [name], PA-C 04/27/21 1434

## 2021-04-27 NOTE — ED Provider Notes (Signed)
[facility] EMERGENCY DEPARTMENT Provider Note   CSN: [id number] Arrival date & time: 04/27/21  1358     History Chief Complaint  Patient presents with  . Abdominal Pain    Amber Hamilton is a 45 y.o. female.  Amber Hamilton , a 45 y.o. female  was evaluated in triage.  Pt complains of right-sided abdominal pain onset yesterday, similar to prior ovarian cyst.  Told a year ago she had a 9 cm ovarian cyst, follow-up with her gynecologist who told her as long as was not bothering her not to do anything about it.  Reports nausea and vomiting today after taking hydrocodone, thinks this was caused by the medication.  States slight dysuria, denies frequency, changes in bowel habits.  No prior abdominal surgeries.        Past Medical History:  Diagnosis Date  . Back injury     There are no problems to display for this patient.   History reviewed. No pertinent surgical history.   OB History   No obstetric history on file.     History reviewed. No pertinent family history.  Social History   Tobacco Use  . Smoking status: Never Smoker  . Smokeless tobacco: Never Used  Vaping Use  . Vaping Use: Never used  Substance Use Topics  . Alcohol use: No  . Drug use: No    Home Medications Prior to Admission medications   Medication Sig Start Date End Date Taking? Authorizing Provider  diazepam (VALIUM) 5 MG tablet Take 1 tablet (5 mg total) by mouth every 8 (eight) hours as needed for muscle spasms (spasms). 06/01/19   Mancel Bale, MD  HYDROcodone-acetaminophen (NORCO) 5-325 MG tablet Take 1 tablet by mouth every 4 (four) hours as needed for moderate pain. 06/01/19   Mancel Bale, MD  HYDROcodone-acetaminophen (NORCO/VICODIN) 5-325 MG tablet Take 1 tablet by mouth every 4 (four) hours as needed. 08/08/20   Tegeler, Canary Brim, MD    Allergies    Metoprolol, Skelaxin [metaxalone], and Sulfa antibiotics  Review of Systems   Review of Systems  Constitutional: Negative for  chills, diaphoresis, fatigue and fever.  Respiratory: Negative for shortness of breath.   Cardiovascular: Negative for chest pain.  Gastrointestinal: Positive for nausea and vomiting. Negative for abdominal pain, constipation and diarrhea.  Genitourinary: Positive for dysuria. Negative for frequency, vaginal bleeding and vaginal discharge.  Musculoskeletal: Negative for back pain.  Skin: Negative for rash and wound.  Allergic/Immunologic: Negative for immunocompromised state.  Neurological: Negative for weakness.  All other systems reviewed and are negative.   Physical Exam Updated Vital Signs BP 108/80 (BP Location: Right Arm)   Pulse 87   Temp 99.1 F (37.3 C) (Oral)   Resp 18   Ht 5\' 4"  (1.626 m)   Wt 99.8 kg   SpO2 100%   BMI 37.76 kg/m   Physical Exam Vitals and nursing note reviewed.  Constitutional:      General: She is not in acute distress.    Appearance: She is well-developed. She is obese. She is not diaphoretic.  HENT:     Head: Normocephalic and atraumatic.  Cardiovascular:     Rate and Rhythm: Normal rate and regular rhythm.  Pulmonary:     Effort: Pulmonary effort is normal.     Breath sounds: Normal breath sounds.  Abdominal:     Palpations: Abdomen is soft.     Tenderness: There is abdominal tenderness in the right lower quadrant. There is guarding. There is no right  CVA tenderness or left CVA tenderness.  Skin:    General: Skin is warm and dry.     Findings: No erythema or rash.  Neurological:     Mental Status: She is alert and oriented to person, place, and time.  Psychiatric:        Behavior: Behavior normal.     ED Results / Procedures / Treatments   Labs (all labs ordered are listed, but only abnormal results are displayed) Labs Reviewed  CBC WITH DIFFERENTIAL/PLATELET - Abnormal; Notable for the following components:      Result Value   RBC 5.61 (*)    Hemoglobin 11.4 (*)    MCV 67.6 (*)    MCH 20.3 (*)    RDW 20.8 (*)    All other  components within normal limits  COMPREHENSIVE METABOLIC PANEL - Abnormal; Notable for the following components:   Sodium 134 (*)    Glucose, Bld 106 (*)    All other components within normal limits  LIPASE, BLOOD  URINALYSIS, ROUTINE W REFLEX MICROSCOPIC  I-STAT BETA HCG BLOOD, ED ([facility], [facility], [facility] ONLY)    EKG None  Radiology No results found.  Procedures Procedures   Medications Ordered in ED Medications  ondansetron (ZOFRAN-ODT) disintegrating tablet 4 mg (has no administration in time range)  HYDROmorphone (DILAUDID) injection 0.5 mg (has no administration in time range)  ondansetron (ZOFRAN) injection 4 mg (has no administration in time range)    ED Course  I have reviewed the triage vital signs and the nursing notes.  Pertinent labs & imaging results that were available during my care of the patient were reviewed by me and considered in my medical decision making (see chart for details).  Clinical Course as of 04/27/21 1839  Sat Apr 27, 2021  2930 45 year old female with complaint of right lower quadrant pain with nausea and vomiting today.  History of large ovarian cyst, thought to be seen.  Ultrasound shows a large right ovarian cyst as well as a smaller left ovarian cyst.  Questionable flow to the right ovary.  CT abdomen pelvis ordered to further assess for concern for appendicitis due to location of pain and is negative for acute abdominal pathology beyond cysts found on prior ultrasound today. Case discussed with Dr. Debroah Loop with OB/GYN service, felt less likely to be torsion due to the size and agrees patient may follow-up with her gynecologist. Plan is to discharge with antiemetic and pain control for patient to follow-up with her GYN. Labs reassuring including CBC with normal white blood cell count, CMP, lipase within normal limits, hCG negative. [LM]    Clinical Course User Index [LM] Alden Hipp   MDM Rules/Calculators/A&P                           Final Clinical Impression(s) / ED Diagnoses Final diagnoses:  Pelvic pain    Rx / DC Orders ED Discharge Orders    None       Alden Hipp 04/27/21 1840    Long, Arlyss Repress, MD 04/28/21 1530

## 2021-04-27 NOTE — ED Triage Notes (Signed)
Pt to er, pt states that she has a hx of an ovarian cyst, states that she started having pain when she woke up yesterday, states that the pain is worse today.

## 2021-04-27 NOTE — Discharge Instructions (Addendum)
Follow up with your GYN, call Monday to schedule an appointment. Take Toradol as needed as prescribed for pain.  Do not take other NSAIDs such as ibuprofen or Aleve if taking Toradol. Take Zofran as needed as prescribed for nausea and vomiting.  Take your leftover hydrocodone as needed for pain not controlled with above medications. Return to the emergency room for pain not controlled with medications, fever, worsening or concerning symptoms.

## 2021-07-05 IMAGING — CT CT ABD-PELV W/ CM
2 of 5 series · 16 of 46 positions shown, 18 images · IV contrast (Omnipaque or Isovue)
Comparison: 08/08/2020 CT.  Ultrasound today.

CLINICAL DATA: 44-year-old female with acute RIGHT abdominal and
pelvic pain.

EXAM:
CT ABDOMEN AND PELVIS WITH CONTRAST
TECHNIQUE: Multidetector CT imaging of the abdomen and pelvis was performed
using the standard protocol following bolus administration of
intravenous contrast.
CONTRAST:  100mL OMNIPAQUE IOHEXOL 300 MG/ML  SOLN

[Series 2: axial st · axial · 0.98mm/px · z∈[+872,+1342]mm · 13 of 106 slices shown, 15 images]
[im 6/106  soft-tissue]
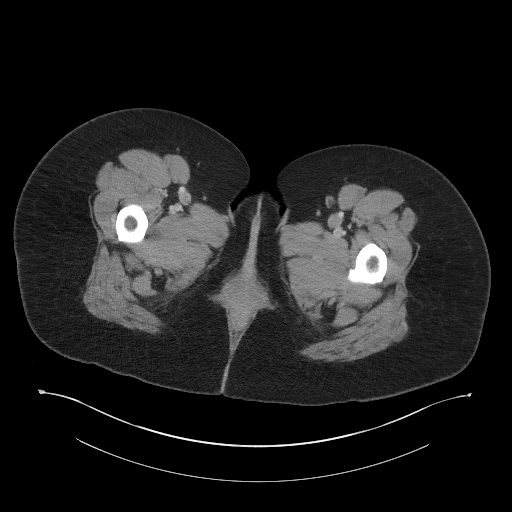
[im 6/106  bone]
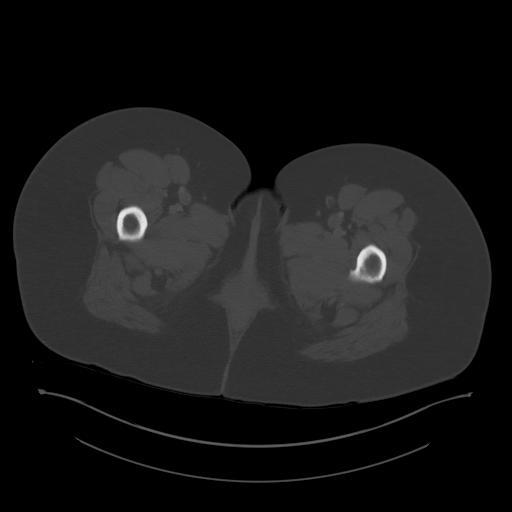
[im 12/106  soft-tissue]
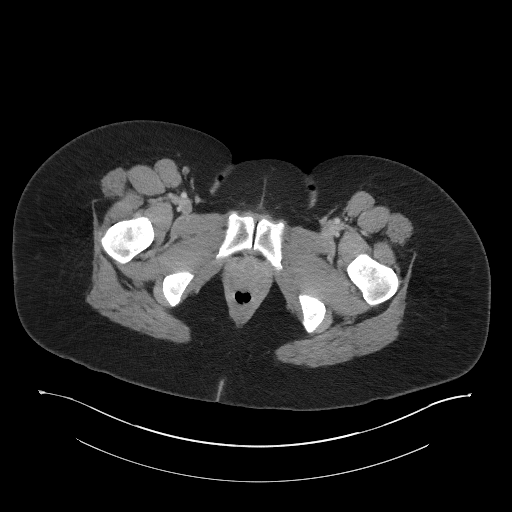
[im 24/106  soft-tissue]
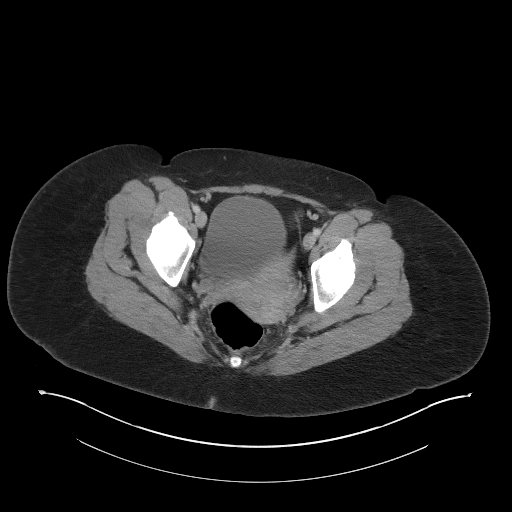
[im 30/106  soft-tissue]
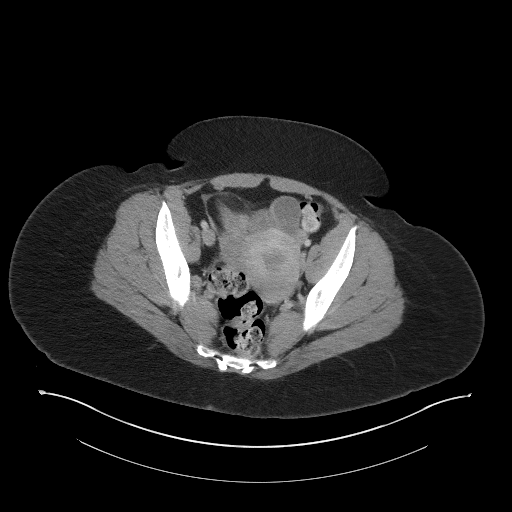
[im 36/106  soft-tissue]
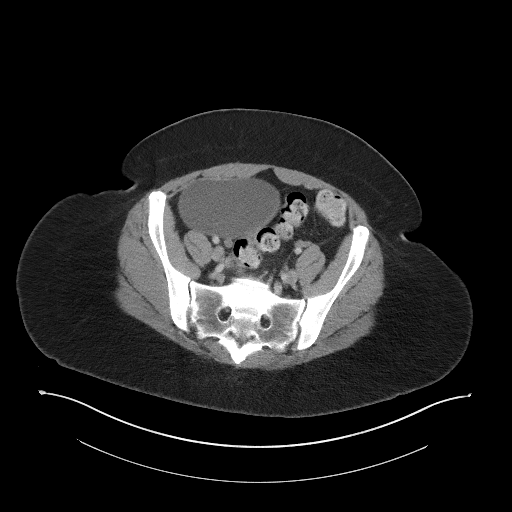
[im 47/106  soft-tissue]
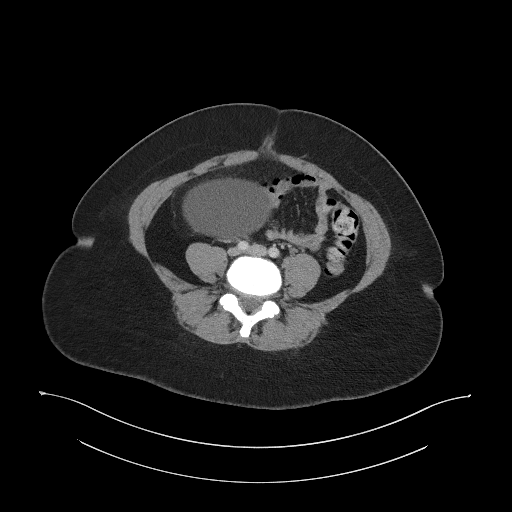
[im 53/106  soft-tissue]
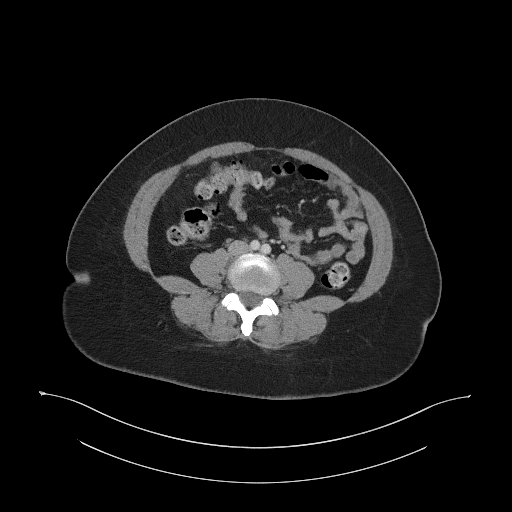
[im 59/106  soft-tissue]
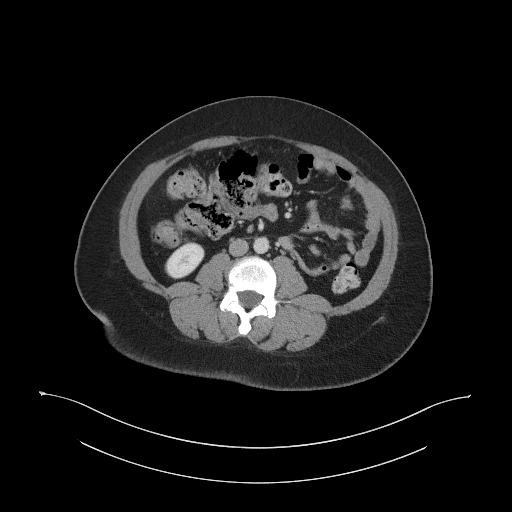
[im 71/106  soft-tissue]
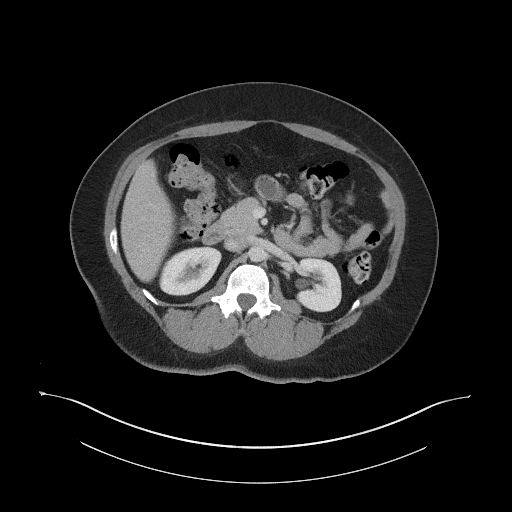
[im 71/106  bone]
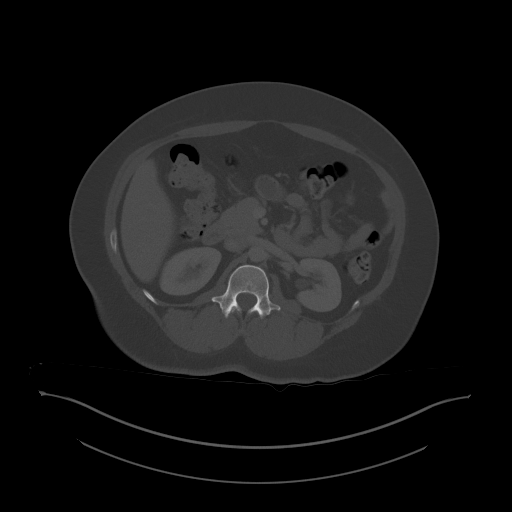
[im 76/106  soft-tissue]
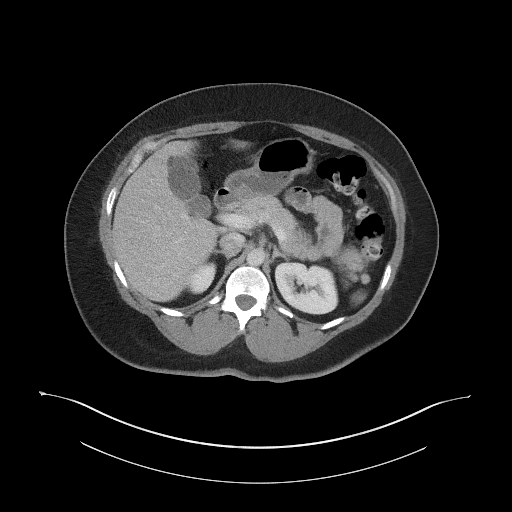
[im 82/106  soft-tissue]
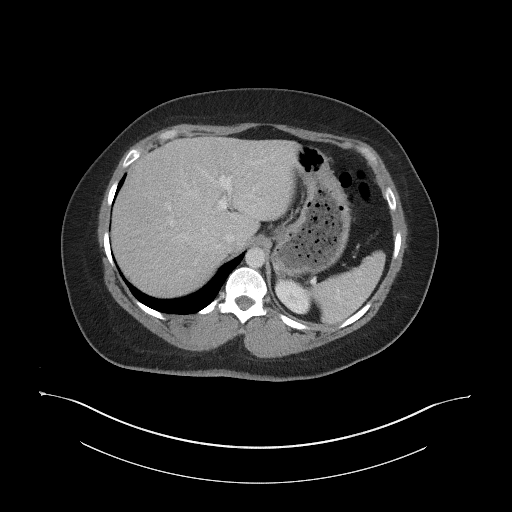
[im 94/106  soft-tissue]
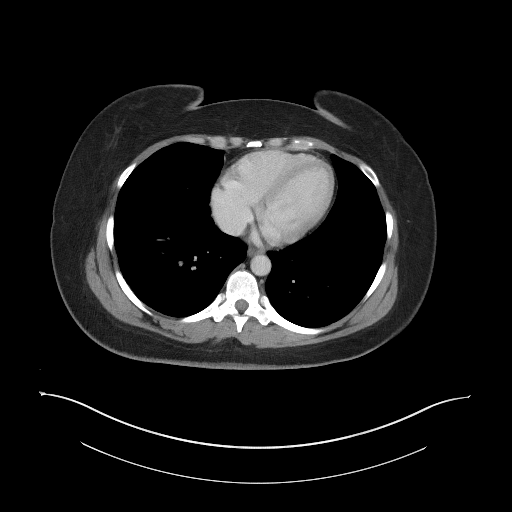
[im 100/106  soft-tissue]
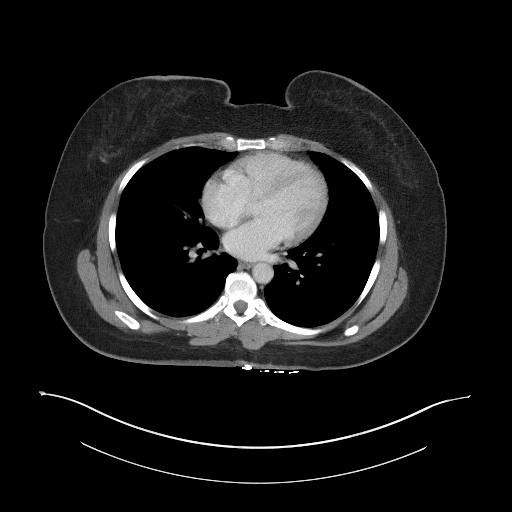

[Series 5: coronal st · coronal · 0.92mm/px · 3 of 127 slices shown]
[im 43/127  soft-tissue]
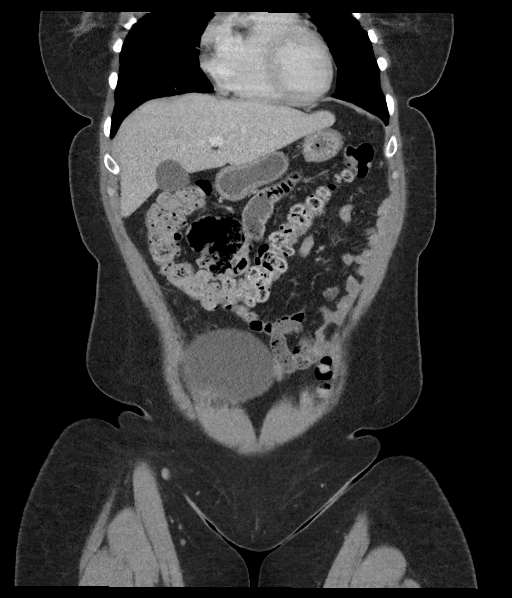
[im 57/127  soft-tissue]
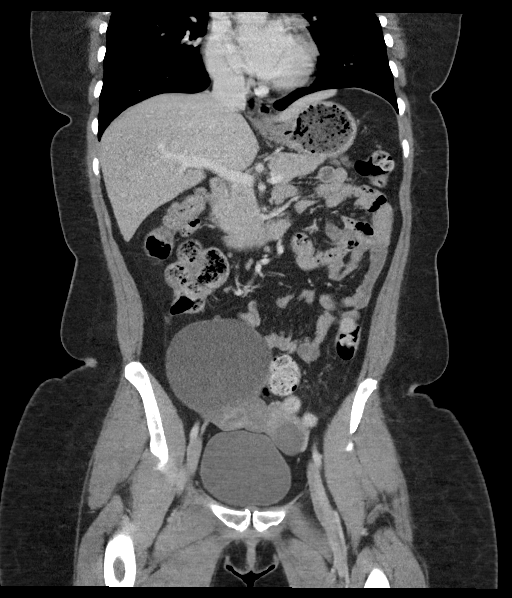
[im 71/127  soft-tissue]
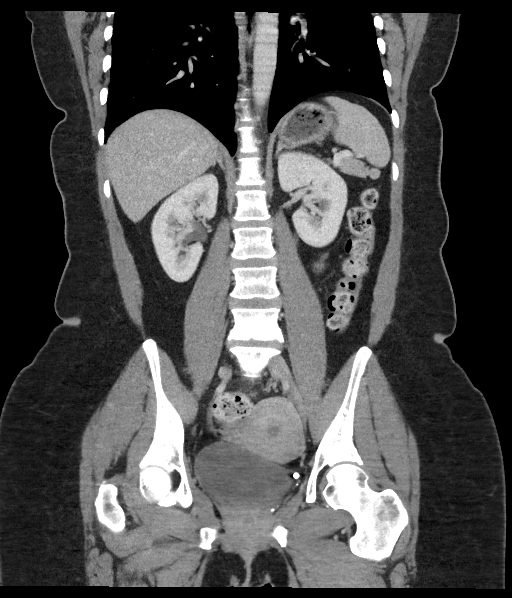

[16 of 46 positions shown; findings below may reference images not displayed]

FINDINGS: Lower chest: No acute abnormality.

Hepatobiliary: The liver and gallbladder are unremarkable. No
biliary dilatation.

Pancreas: Unremarkable

Spleen: Unremarkable

Adrenals/Urinary Tract: The kidneys, adrenal glands and bladder are
unremarkable. There is no evidence of hydronephrosis or urinary
calculi.

Stomach/Bowel: Stomach is within normal limits. Appendix appears
normal. No evidence of bowel wall thickening, distention, or
inflammatory changes.

Vascular/Lymphatic: No significant vascular findings are present. No
enlarged abdominal or pelvic lymph nodes.

Reproductive: A 6.7 x 10.8 cm RIGHT ovarian cyst is identified, not
significantly changed from last CT and characterized as a simple
cyst on today's ultrasound.

A 3 x 4 cm LEFT ovarian cyst is noted, also characterized on today's
ultrasound.

No uterine abnormalities are identified on CT.

Other: No free fluid, pneumoperitoneum or focal collection.

Musculoskeletal: No acute or suspicious bony abnormalities.
IMPRESSION: 1. No evidence of acute abnormality.
2. Bilateral ovarian cysts as described and characterized on today's
ultrasound.

## 2022-05-16 ENCOUNTER — Emergency Department (HOSPITAL_BASED_OUTPATIENT_CLINIC_OR_DEPARTMENT_OTHER): Payer: Medicaid Other

## 2022-05-16 ENCOUNTER — Encounter (HOSPITAL_BASED_OUTPATIENT_CLINIC_OR_DEPARTMENT_OTHER): Payer: Self-pay

## 2022-05-16 ENCOUNTER — Other Ambulatory Visit: Payer: Self-pay

## 2022-05-16 ENCOUNTER — Emergency Department (HOSPITAL_BASED_OUTPATIENT_CLINIC_OR_DEPARTMENT_OTHER)
Admission: EM | Admit: 2022-05-16 | Discharge: 2022-05-16 | Disposition: A | Discharge status: Home / Self Care | Payer: Medicaid Other | Attending: Emergency Medicine | Admitting: Emergency Medicine

## 2022-05-16 DIAGNOSIS — R079 Chest pain, unspecified: Secondary | ICD-10-CM | POA: Insufficient documentation

## 2022-05-16 LAB — TROPONIN I (HIGH SENSITIVITY): Troponin I (High Sensitivity): <2 ng/L (ref <18)

## 2022-05-16 LAB — CBC
HCT: 35 % — ABNORMAL LOW (ref 36.0–46.0)
Hemoglobin: 10.5 g/dL — ABNORMAL LOW (ref 12.0–15.0)
MCH: 19.1 pg — ABNORMAL LOW (ref 26.0–34.0)
MCHC: 30 g/dL (ref 30.0–36.0)
MCV: 63.5 fL — ABNORMAL LOW (ref 80.0–100.0)
Platelets: 253 10*3/uL (ref 150–400)
RBC: 5.51 MIL/uL — ABNORMAL HIGH (ref 3.87–5.11)
RDW: 20.8 % — ABNORMAL HIGH (ref 11.5–15.5)
WBC: 4.4 10*3/uL (ref 4.0–10.5)
nRBC: 0 % (ref 0.0–0.2)

## 2022-05-16 LAB — BASIC METABOLIC PANEL
Anion gap: 4 — ABNORMAL LOW (ref 5–15)
BUN: 10 mg/dL (ref 6–20)
CO2: 27 mmol/L (ref 22–32)
Calcium: 8.9 mg/dL (ref 8.9–10.3)
Chloride: 102 mmol/L (ref 98–111)
Creatinine, Ser: 0.76 mg/dL (ref 0.44–1.00)
GFR, Estimated: >60 mL/min (ref >60)
Glucose, Bld: 113 mg/dL — ABNORMAL HIGH (ref 70–99)
Potassium: 3.8 mmol/L (ref 3.5–5.1)
Sodium: 133 mmol/L — ABNORMAL LOW (ref 135–145)

## 2022-05-16 LAB — PREGNANCY, URINE: Preg Test, Ur: NEGATIVE

## 2022-05-16 MED ORDER — ASPIRIN 325 MG PO TABS
325.0000 mg | ORAL_TABLET | Freq: Every day | ORAL | Status: DC
  Filled 2022-05-16: qty 1

## 2022-05-16 MED ORDER — ALUM & MAG HYDROXIDE-SIMETH 200-200-20 MG/5ML PO SUSP
15.0000 mL | Freq: Once | ORAL | Status: AC
Start: 2022-05-16 — End: 2022-05-16
  Administered 2022-05-16: 15 mL via ORAL
  Filled 2022-05-16: qty 30

## 2022-05-16 NOTE — ED Triage Notes (Signed)
Pt presents with intermittent CP x 1 week. Pain is located to R chest and described as sharp. Denies N/V or ShOB.

## 2022-05-16 NOTE — Discharge Instructions (Signed)
Try taking Pepcid for the chest pain.  You had a 4 mm nodule on the chest x-ray, follow-up with your primary care to schedule CT chest for further evaluation if indicated.  Return to the ED if things change or worsen

## 2022-05-16 NOTE — ED Provider Notes (Addendum)
[facility] EMERGENCY DEPARTMENT Provider Note   CSN: [id number] Arrival date & time: 05/16/22  1216     History  Chief Complaint  Patient presents with   Chest Pain    Amber Hamilton is a 46 y.o. female.   Chest Pain  Patient presents with chest pain.  Is been happening intermittently for the last week, she feels it is right side of her chest below her breast and in the center of her chest.  It feels sharp, unable to define any provoking features.  Not worse with any inspiration.  Radiates to her back but not her neck or arm or jaw.  Denies any abdominal pain, no nausea or vomiting.  Does not feel short of breath.  Does not smoke cigarettes.  No history of previous PEs or MIs, not on oral birth control.  Home Medications Prior to Admission medications   Medication Sig Start Date End Date Taking? Authorizing Provider  diazepam (VALIUM) 5 MG tablet Take 1 tablet (5 mg total) by mouth every 8 (eight) hours as needed for muscle spasms (spasms). 06/01/19   Mancel Bale, MD  HYDROcodone-acetaminophen (NORCO) 5-325 MG tablet Take 1 tablet by mouth every 4 (four) hours as needed for moderate pain. 06/01/19   Mancel Bale, MD  HYDROcodone-acetaminophen (NORCO/VICODIN) 5-325 MG tablet Take 1 tablet by mouth every 4 (four) hours as needed. 08/08/20   Tegeler, Canary Brim, MD  ketorolac (TORADOL) 10 MG tablet Take 1 tablet (10 mg total) by mouth every 6 (six) hours as needed. 04/27/21   Jeannie Fend, PA-C  ondansetron (ZOFRAN ODT) 4 MG disintegrating tablet Take 1 tablet (4 mg total) by mouth every 8 (eight) hours as needed for nausea or vomiting. 04/27/21   Jeannie Fend, PA-C      Allergies    Metoprolol, Skelaxin [metaxalone], and Sulfa antibiotics    Review of Systems   Review of Systems  Cardiovascular:  Positive for chest pain.   Physical Exam Updated Vital Signs BP 132/74 (BP Location: Left Arm)   Pulse 81   Temp 98 F (36.7 C) (Oral)   Resp 18   Ht 5\' 4"  (1.626  m)   Wt 101.6 kg   LMP 05/01/2022   SpO2 100%   BMI 38.45 kg/m  Physical Exam Vitals and nursing note reviewed. Exam conducted with a chaperone present.  Constitutional:      Appearance: Normal appearance.     Comments: BMI 38  HENT:     Head: Normocephalic and atraumatic.  Eyes:     General: No scleral icterus.       Right eye: No discharge.        Left eye: No discharge.     Extraocular Movements: Extraocular movements intact.     Pupils: Pupils are equal, round, and reactive to light.  Cardiovascular:     Rate and Rhythm: Normal rate and regular rhythm.     Pulses: Normal pulses.     Heart sounds: Normal heart sounds. No murmur heard.   No friction rub. No gallop.  Pulmonary:     Effort: Pulmonary effort is normal. No respiratory distress.     Breath sounds: Normal breath sounds.  Abdominal:     General: Abdomen is flat. Bowel sounds are normal. There is no distension.     Palpations: Abdomen is soft.     Tenderness: There is no abdominal tenderness.     Comments: Abdomen is soft, no midline quadrant tenderness.  Negative 07/01/2022  Skin:    General: Skin is warm and dry.     Coloration: Skin is not jaundiced.  Neurological:     Mental Status: She is alert. Mental status is at baseline.     Coordination: Coordination normal.    ED Results / Procedures / Treatments   Labs (all labs ordered are listed, but only abnormal results are displayed) Labs Reviewed  BASIC METABOLIC PANEL - Abnormal; Notable for the following components:      Result Value   Sodium 133 (*)    Glucose, Bld 113 (*)    Anion gap 4 (*)    All other components within normal limits  CBC - Abnormal; Notable for the following components:   RBC 5.51 (*)    Hemoglobin 10.5 (*)    HCT 35.0 (*)    MCV 63.5 (*)    MCH 19.1 (*)    RDW 20.8 (*)    All other components within normal limits  PREGNANCY, URINE  TROPONIN I (HIGH SENSITIVITY)    EKG EKG Interpretation  Date/Time:  Friday May 16 2022  12:27:01 EDT Ventricular Rate:  79 PR Interval:  168 QRS Duration: 80 QT Interval:  374 QTC Calculation: 428 R Axis:   34 Text Interpretation: Normal sinus rhythm with sinus arrhythmia Normal ECG No old tracing to compare Confirmed by Melene Plan [phone]) on 05/16/2022 12:35:46 PM  Radiology DG Chest 2 View  Result Date: 05/16/2022 CLINICAL DATA:  Provided history: Chest pain EXAM: CHEST - 2 VIEW COMPARISON:  Prior chest radiographs 08/24/2020 and earlier. FINDINGS: Heart size within normal limits. Apparent nodule within the right upper lobe, measuring at least 4 mm. No appreciable airspace consolidation. No evidence of pleural effusion or pneumothorax. No acute bony abnormality identified. IMPRESSION: Apparent pulmonary nodule within the right upper lobe, measuring at least 4 mm. A non-emergent chest CT is recommended for further evaluation. No appreciable airspace consolidation or pulmonary edema. Electronically Signed   By: Jackey Loge D.O.   On: 05/16/2022 13:02    Procedures Procedures    Medications Ordered in ED Medications  alum & mag hydroxide-simeth (MAALOX/MYLANTA) 200-200-20 MG/5ML suspension 15 mL (15 mLs Oral Given 05/16/22 1330)    ED Course/ Medical Decision Making/ A&P                           Medical Decision Making Amount and/or Complexity of Data Reviewed Labs: ordered. Radiology: ordered.  Risk OTC drugs.   Patient presents with chest pain.  Differential diagnosis includes ACS, PE, pneumonia, pneumothorax, GERD, esophageal rupture, gastritis.  On exam patient is well-appearing, pain is not reproducible making costochondritis unlikely.  Her vitals are stable, she is not hypoxic or tachycardic.  She is PERC negative.  No comorbidities, does not smoke cigarettes no history of cigarette usage.  I ordered, viewed and interpreted laboratory work-up.  Per my interpretation no leukocytosis or gross electrolyte derangement.  Slightly anemic at 10.5 hemoglobin.  No  AKI.  Troponin is less than 2.  I ordered and viewed the chest x-ray.  Per my interpretation patient does have a 4 mm nodule to the right upper lung.  I agree with radiologist interpretation.  EKG shows sinus rhythm, no ischemic findings.  Patient was on cardiac monitoring, on reevaluation I viewed the monitor which showed sinus rhythm with a rate of 81 bpm.  Reevaluation patient is symptom-free.  I ordered patient Maalox, I also reviewed her home medicine and made changes  where I felt appropriately.  I discussed the pulmonary nodule with patient.  She does not have any cigarette smoking, I have a low suspicion for lung cancer but this will need to be followed up on not emergently.  We discussed the need for her to follow-up with her primary regarding CT chest for further evaluation of the nodule.  Considered additional work-up and hospitalization but I do not feel that is warranted.  Patient's heart score is low at 1, specially given no elevation of the troponin and negative EKG for ischemic findings I doubt this is ACS.  Patient is PERC negative.        Final Clinical Impression(s) / ED Diagnoses Final diagnoses:  None    Rx / DC Orders ED Discharge Orders     None         Theron AristaSage, Epifanio Labrador, PA-C 05/16/22 1331    Theron AristaSage, Nazeer Romney, PA-C 05/16/22 1332    Melene PlanFloyd, Dan, DO 05/16/22 1452

## 2022-05-16 NOTE — ED Notes (Signed)
Discharge instructions and recommendations reviewed with patient. States understanding. Ambulatory and discharged to home

## 2022-07-24 IMAGING — CR DG CHEST 2V
2 series · 2 of 2 positions shown · non-contrast
Comparison: Prior chest radiographs 08/24/2020 and earlier.

CLINICAL DATA: Provided history: Chest pain

EXAM:
CHEST - 2 VIEW

[w chest pa]
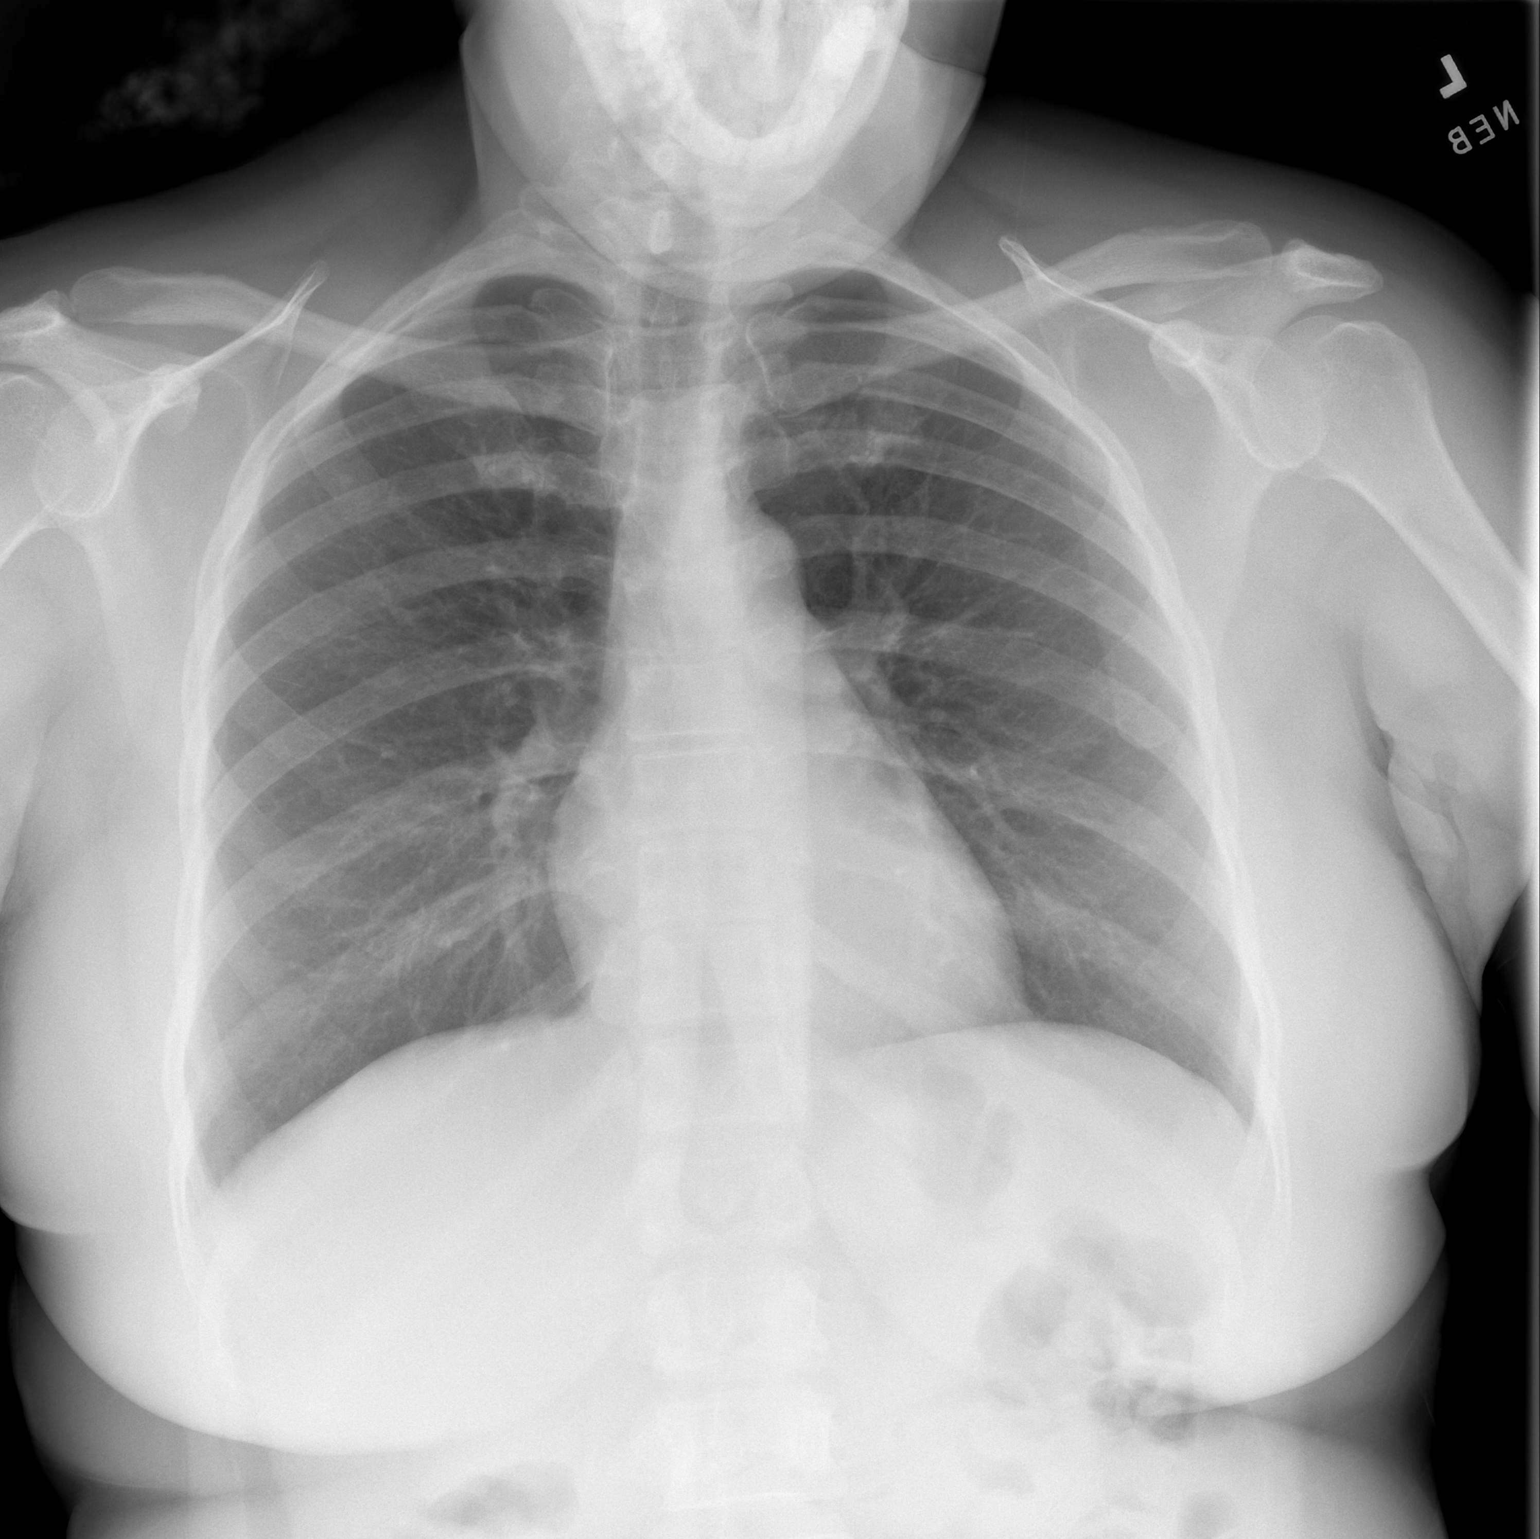

[w chest lat *]
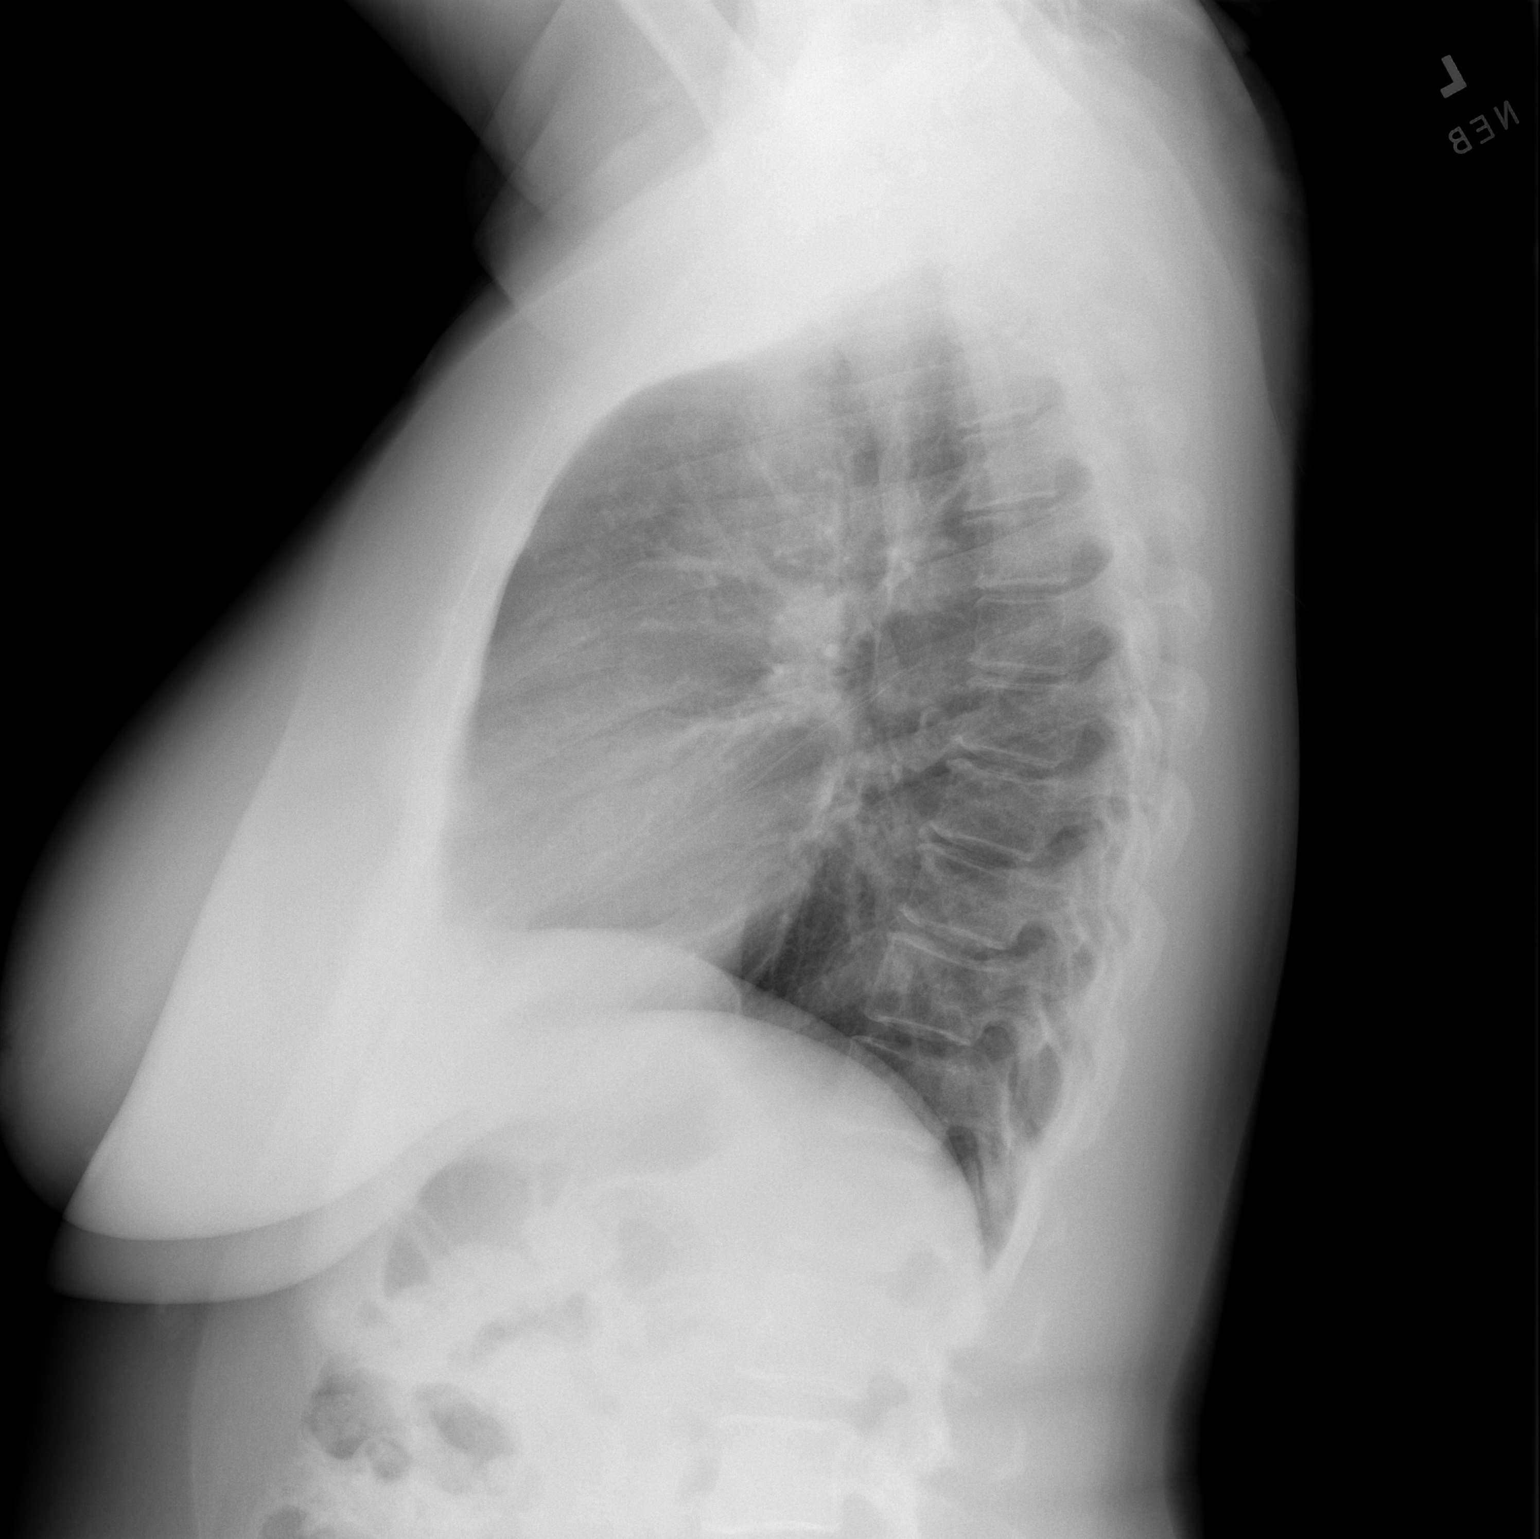

[2 of 2 positions shown; findings below may reference images not displayed]

FINDINGS: Heart size within normal limits. Apparent nodule within the right
upper lobe, measuring at least 4 mm. No appreciable airspace
consolidation. No evidence of pleural effusion or pneumothorax. No
acute bony abnormality identified.
IMPRESSION: Apparent pulmonary nodule within the right upper lobe, measuring at
least 4 mm. A non-emergent chest CT is recommended for further
evaluation.

No appreciable airspace consolidation or pulmonary edema.

## 2022-12-11 ENCOUNTER — Encounter: Payer: Self-pay | Admitting: Emergency Medicine

## 2022-12-11 ENCOUNTER — Ambulatory Visit
Admission: EM | Admit: 2022-12-11 | Discharge: 2022-12-11 | Disposition: A | Discharge status: Home / Self Care | Payer: BC Managed Care – PPO | Attending: Nurse Practitioner | Admitting: Nurse Practitioner

## 2022-12-11 DIAGNOSIS — Z79899 Other long term (current) drug therapy: Secondary | ICD-10-CM | POA: Insufficient documentation

## 2022-12-11 DIAGNOSIS — Z1152 Encounter for screening for COVID-19: Secondary | ICD-10-CM | POA: Diagnosis not present

## 2022-12-11 DIAGNOSIS — R6889 Other general symptoms and signs: Secondary | ICD-10-CM | POA: Diagnosis not present

## 2022-12-11 DIAGNOSIS — E7800 Pure hypercholesterolemia, unspecified: Secondary | ICD-10-CM | POA: Insufficient documentation

## 2022-12-11 DIAGNOSIS — Z28310 Unvaccinated for covid-19: Secondary | ICD-10-CM | POA: Insufficient documentation

## 2022-12-11 DIAGNOSIS — B349 Viral infection, unspecified: Secondary | ICD-10-CM | POA: Diagnosis not present

## 2022-12-11 DIAGNOSIS — R058 Other specified cough: Secondary | ICD-10-CM | POA: Insufficient documentation

## 2022-12-11 HISTORY — DX: Pure hypercholesterolemia, unspecified: E78.00

## 2022-12-11 HISTORY — DX: Supraventricular tachycardia, unspecified: I47.10

## 2022-12-11 LAB — RESP PANEL BY RT-PCR (FLU A&B, COVID) ARPGX2
Influenza A by PCR: POSITIVE — AB
Influenza B by PCR: NEGATIVE
SARS Coronavirus 2 by RT PCR: NEGATIVE

## 2022-12-11 MED ORDER — OSELTAMIVIR PHOSPHATE 75 MG PO CAPS: 75.0000 mg | ORAL_CAPSULE | Freq: Two times a day (BID) | ORAL | 0 refills | Status: AC

## 2022-12-11 MED ORDER — PROMETHAZINE-DM 6.25-15 MG/5ML PO SYRP: 5.0000 mL | ORAL_SOLUTION | Freq: Four times a day (QID) | ORAL | 0 refills | Status: AC | PRN

## 2022-12-11 NOTE — ED Triage Notes (Signed)
Feels weak, chills, body aches, cough since yesterday.  Has been taking nyquil.

## 2022-12-11 NOTE — ED Provider Notes (Signed)
[facility]    CSN: [id number] Arrival date & time: 12/11/22  1225      History   Chief Complaint Chief Complaint  Patient presents with   Cough    Covid test - Entered by patient    HPI Amber Hamilton is a 46 y.o. female.   The history is provided by the patient.   The patient presents with a 1 day history of weakness, chills, generalized bodyaches, headache and cough.  Patient denies fever, ear pain, wheezing, shortness of breath, difficulty breathing, or GI symptoms.  Patient states symptoms started suddenly.  She states that she was around a coworker who coughed while she was in the bathroom at work.  She reports she has been taking NyQuil for her symptoms.  She has not been vaccinated for COVID or influenza.  Past Medical History:  Diagnosis Date   Back injury    High cholesterol    SVT (supraventricular tachycardia)     There are no problems to display for this patient.   Past Surgical History:  Procedure Laterality Date   LAPAROSCOPIC OVARIAN      OB History   No obstetric history on file.      Home Medications    Prior to Admission medications   Medication Sig Start Date End Date Taking? Authorizing Provider  atorvastatin (LIPITOR) 10 MG tablet Take 10 mg by mouth daily.   Yes [provider]  diltiazem (CARDIZEM CD) 180 MG 24 hr capsule Take 180 mg by mouth daily.   Yes [provider]  oseltamivir (TAMIFLU) 75 MG capsule Take 1 capsule (75 mg total) by mouth every 12 (twelve) hours. 12/11/22  Yes Taytem Ghattas-Warren, Sadie Haber, NP  promethazine-dextromethorphan (PROMETHAZINE-DM) 6.25-15 MG/5ML syrup Take 5 mLs by mouth 4 (four) times daily as needed for cough. 12/11/22  Yes Haylen Shelnutt-Warren, Sadie Haber, NP  diazepam (VALIUM) 5 MG tablet Take 1 tablet (5 mg total) by mouth every 8 (eight) hours as needed for muscle spasms (spasms). 06/01/19   Mancel Bale, MD  HYDROcodone-acetaminophen (NORCO) 5-325 MG tablet Take 1 tablet by mouth  every 4 (four) hours as needed for moderate pain. 06/01/19   Mancel Bale, MD  HYDROcodone-acetaminophen (NORCO/VICODIN) 5-325 MG tablet Take 1 tablet by mouth every 4 (four) hours as needed. 08/08/20   Tegeler, Canary Brim, MD  ketorolac (TORADOL) 10 MG tablet Take 1 tablet (10 mg total) by mouth every 6 (six) hours as needed. 04/27/21   Jeannie Fend, PA-C  ondansetron (ZOFRAN ODT) 4 MG disintegrating tablet Take 1 tablet (4 mg total) by mouth every 8 (eight) hours as needed for nausea or vomiting. 04/27/21   Jeannie Fend, PA-C    Family History History reviewed. No pertinent family history.  Social History Social History   Tobacco Use   Smoking status: Never   Smokeless tobacco: Never  Vaping Use   Vaping Use: Never used  Substance Use Topics   Alcohol use: No   Drug use: No     Allergies   Metoprolol, Skelaxin [metaxalone], and Sulfa antibiotics   Review of Systems Review of Systems Per HPI  Physical Exam Triage Vital Signs ED Triage Vitals  Enc Vitals Group     BP 12/11/22 1258 112/71     Pulse Rate 12/11/22 1258 (!) 115     Resp 12/11/22 1258 18     Temp 12/11/22 1258 99.8 F (37.7 C)     Temp Source 12/11/22 1258 Oral     SpO2  12/11/22 1258 95 %     Weight --      Height --      Head Circumference --      Peak Flow --      Pain Score 12/11/22 1259 6     Pain Loc --      Pain Edu? --      Excl. in GC? --    No data found.  Updated Vital Signs BP 112/71 (BP Location: Right Arm)   Pulse (!) 115   Temp 99.8 F (37.7 C) (Oral)   Resp 18   LMP 12/02/2022 (Exact Date)   SpO2 95%   Visual Acuity Right Eye Distance:   Left Eye Distance:   Bilateral Distance:    Right Eye Near:   Left Eye Near:    Bilateral Near:     Physical Exam Vitals and nursing note reviewed.  Constitutional:      General: She is not in acute distress.    Appearance: Normal appearance.  HENT:     Head: Normocephalic.     Right Ear: Tympanic membrane, ear canal and  external ear normal.     Left Ear: Tympanic membrane, ear canal and external ear normal.     Nose: Congestion present.     Right Turbinates: Not enlarged or swollen.     Left Turbinates: Not enlarged or swollen.     Right Sinus: No maxillary sinus tenderness or frontal sinus tenderness.     Left Sinus: No maxillary sinus tenderness or frontal sinus tenderness.     Mouth/Throat:     Lips: Pink.     Mouth: Mucous membranes are moist.     Pharynx: Oropharynx is clear. Uvula midline. Posterior oropharyngeal erythema present. No pharyngeal swelling, oropharyngeal exudate or uvula swelling.     Tonsils: No tonsillar exudate.  Eyes:     Extraocular Movements: Extraocular movements intact.     Pupils: Pupils are equal, round, and reactive to light.  Cardiovascular:     Rate and Rhythm: Regular rhythm. Tachycardia present.     Pulses: Normal pulses.     Heart sounds: Normal heart sounds.  Pulmonary:     Effort: Pulmonary effort is normal. No respiratory distress.     Breath sounds: Normal breath sounds. No stridor. No wheezing, rhonchi or rales.  Abdominal:     General: Bowel sounds are normal.     Palpations: Abdomen is soft.     Tenderness: There is no abdominal tenderness.  Musculoskeletal:     Cervical back: Normal range of motion.  Lymphadenopathy:     Cervical: No cervical adenopathy.  Skin:    General: Skin is warm and dry.  Neurological:     General: No focal deficit present.     Mental Status: She is alert and oriented to person, place, and time.  Psychiatric:        Mood and Affect: Mood normal.        Behavior: Behavior normal.      UC Treatments / Results  Labs (all labs ordered are listed, but only abnormal results are displayed) Labs Reviewed  RESP PANEL BY RT-PCR (FLU A&B, COVID) [id number]    EKG   Radiology No results found.  Procedures Procedures (including critical care time)  Medications Ordered in UC Medications - No data to display  Initial  Impression / Assessment and Plan / UC Course  I have reviewed the triage vital signs and the nursing notes.  Pertinent labs & imaging  results that were available during my care of the patient were reviewed by me and considered in my medical decision making (see chart for details).  The patient is well-appearing, she is in no acute distress, vital signs are stable.  Suspect viral illness/flulike symptoms.  COVID/flu test is pending.  Will start patient on Tamiflu 75 mg in the interim.  Patient was advised that if the results of the COVID/flu test are negative, she will need to stop the medication, or if the results are positive for COVID, she will stop the Tamiflu and start an antiviral therapy for COVID.  Patient is in agreement with this plan.  Patient is a candidate to receive molnupiravir.  Symptomatic treatment was provided to the patient to include Promethazine DM for her cough.  Supportive care recommendations were provided to the patient to include increasing fluids, allowing for plenty of rest, and use of Tylenol or ibuprofen as needed for pain or discomfort.  Patient verbalizes understanding.  All questions were answered.  Patient was provided a note for work.  Patient is stable for discharge. Final Clinical Impressions(s) / UC Diagnoses   Final diagnoses:  Flu-like symptoms  Viral illness     Discharge Instructions      COVID/flu test is pending.  As discussed, you will be contacted if the pending test results are positive.  You are being prescribed Tamiflu today.  If the COVID test result is positive, you will need to stop the Tamiflu and start another antiviral therapy which we will prescribe at that time. Take medication as prescribed. May take Tylenol or Ibuprofen as needed for pain, fever, or general discomfort. Increase fluids and allow for plenty of rest. Warm salt water gargles 3-4 times daily as needed for throat pain or discomfort. A viral illness can last from 7 to 14  days.  If your symptoms suddenly worsen, or do not improve after that time, please follow-up in this clinic or with your primary care physician for further evaluation. Follow-up as needed.     ED Prescriptions     Medication Sig Dispense Auth. Provider   oseltamivir (TAMIFLU) 75 MG capsule Take 1 capsule (75 mg total) by mouth every 12 (twelve) hours. 10 capsule Vernie Piet-Warren, Sadie Haber, NP   promethazine-dextromethorphan (PROMETHAZINE-DM) 6.25-15 MG/5ML syrup Take 5 mLs by mouth 4 (four) times daily as needed for cough. 118 mL Asaiah Hunnicutt-Warren, Sadie Haber, NP      PDMP not reviewed this encounter.   Abran Cantor, NP 12/11/22 1333

## 2022-12-11 NOTE — Discharge Instructions (Signed)
COVID/flu test is pending.  As discussed, you will be contacted if the pending test results are positive.  You are being prescribed Tamiflu today.  If the COVID test result is positive, you will need to stop the Tamiflu and start another antiviral therapy which we will prescribe at that time. Take medication as prescribed. May take Tylenol or Ibuprofen as needed for pain, fever, or general discomfort. Increase fluids and allow for plenty of rest. Warm salt water gargles 3-4 times daily as needed for throat pain or discomfort. A viral illness can last from 7 to 14 days.  If your symptoms suddenly worsen, or do not improve after that time, please follow-up in this clinic or with your primary care physician for further evaluation. Follow-up as needed.

## 2024-06-03 NOTE — Progress Notes (Signed)
 Patient tolerated Venofer infusion well.  Medication education provided. Pt received Benadryl  with treatment today. Pt has been counseled on the possible effects of Benadryl  and the need for transportation. Patient verbalized understanding.  AVS declined.  Patient discharged alert and ambulatory.
